# Patient Record
Sex: Female | Born: 1955 | Race: White | Hispanic: No | Marital: Single | Smoking: Former smoker
Health system: Southern US, Community
[De-identification: ages and names within clinical notes are randomized; demographics above are authoritative.]

## PROBLEM LIST (undated history)

## (undated) DIAGNOSIS — T7840XA Allergy, unspecified, initial encounter: Secondary | ICD-10-CM

## (undated) DIAGNOSIS — IMO0001 Reserved for inherently not codable concepts without codable children: Secondary | ICD-10-CM

## (undated) DIAGNOSIS — E785 Hyperlipidemia, unspecified: Secondary | ICD-10-CM

## (undated) HISTORY — DX: Allergy, unspecified, initial encounter: T78.40XA

## (undated) HISTORY — PX: POLYPECTOMY: SHX149

## (undated) HISTORY — DX: Hyperlipidemia, unspecified: E78.5

## (undated) HISTORY — DX: Reserved for inherently not codable concepts without codable children: IMO0001

## (undated) HISTORY — PX: DENTAL SURGERY: SHX609

## (undated) HISTORY — PX: COLONOSCOPY: SHX174

---

## 2000-05-06 ENCOUNTER — Encounter: Payer: Self-pay | Admitting: Gynecology

## 2000-05-06 ENCOUNTER — Ambulatory Visit (HOSPITAL_COMMUNITY): Admission: RE | Admit: 2000-05-06 | Discharge: 2000-05-06 | Payer: Self-pay | Admitting: Gynecology

## 2000-05-31 ENCOUNTER — Other Ambulatory Visit: Admission: RE | Admit: 2000-05-31 | Discharge: 2000-05-31 | Payer: Self-pay | Admitting: Gynecology

## 2003-03-16 ENCOUNTER — Other Ambulatory Visit: Admission: RE | Admit: 2003-03-16 | Discharge: 2003-03-16 | Payer: Self-pay | Admitting: Gynecology

## 2003-08-21 ENCOUNTER — Encounter: Payer: Self-pay | Admitting: Gynecology

## 2003-08-21 ENCOUNTER — Ambulatory Visit (HOSPITAL_COMMUNITY): Admission: RE | Admit: 2003-08-21 | Discharge: 2003-08-21 | Payer: Self-pay | Admitting: Obstetrics and Gynecology

## 2005-02-11 ENCOUNTER — Ambulatory Visit (HOSPITAL_COMMUNITY): Admission: RE | Admit: 2005-02-11 | Discharge: 2005-02-11 | Payer: Self-pay | Admitting: Gynecology

## 2006-05-26 ENCOUNTER — Other Ambulatory Visit: Admission: RE | Admit: 2006-05-26 | Discharge: 2006-05-26 | Payer: Self-pay | Admitting: Gynecology

## 2007-07-22 ENCOUNTER — Ambulatory Visit (HOSPITAL_COMMUNITY): Admission: RE | Admit: 2007-07-22 | Discharge: 2007-07-22 | Payer: Self-pay | Admitting: Internal Medicine

## 2007-07-26 ENCOUNTER — Encounter: Admission: RE | Admit: 2007-07-26 | Discharge: 2007-07-26 | Payer: Self-pay | Admitting: Internal Medicine

## 2008-07-24 ENCOUNTER — Encounter: Admission: RE | Admit: 2008-07-24 | Discharge: 2008-07-24 | Payer: Self-pay | Admitting: Internal Medicine

## 2008-07-27 ENCOUNTER — Encounter: Admission: RE | Admit: 2008-07-27 | Discharge: 2008-07-27 | Payer: Self-pay | Admitting: Internal Medicine

## 2009-03-19 ENCOUNTER — Encounter (INDEPENDENT_AMBULATORY_CARE_PROVIDER_SITE_OTHER): Payer: Self-pay | Admitting: *Deleted

## 2009-07-29 ENCOUNTER — Ambulatory Visit (HOSPITAL_COMMUNITY): Admission: RE | Admit: 2009-07-29 | Discharge: 2009-07-29 | Payer: Self-pay | Admitting: Internal Medicine

## 2010-11-05 ENCOUNTER — Ambulatory Visit (HOSPITAL_COMMUNITY)
Admission: RE | Admit: 2010-11-05 | Discharge: 2010-11-05 | Payer: Self-pay | Source: Home / Self Care | Attending: Internal Medicine | Admitting: Internal Medicine

## 2011-05-07 ENCOUNTER — Other Ambulatory Visit: Payer: Self-pay | Admitting: Women's Health

## 2011-05-07 ENCOUNTER — Other Ambulatory Visit (HOSPITAL_COMMUNITY)
Admission: RE | Admit: 2011-05-07 | Discharge: 2011-05-07 | Disposition: A | Discharge status: Home / Self Care | Payer: BC Managed Care – PPO | Source: Ambulatory Visit | Attending: Gynecology | Admitting: Gynecology

## 2011-05-07 ENCOUNTER — Ambulatory Visit (INDEPENDENT_AMBULATORY_CARE_PROVIDER_SITE_OTHER): Payer: BC Managed Care – PPO | Admitting: Women's Health

## 2011-05-07 DIAGNOSIS — Z13220 Encounter for screening for lipoid disorders: Secondary | ICD-10-CM

## 2011-05-07 DIAGNOSIS — Z01419 Encounter for gynecological examination (general) (routine) without abnormal findings: Secondary | ICD-10-CM

## 2011-05-07 DIAGNOSIS — Z833 Family history of diabetes mellitus: Secondary | ICD-10-CM

## 2011-05-07 DIAGNOSIS — Z124 Encounter for screening for malignant neoplasm of cervix: Secondary | ICD-10-CM | POA: Insufficient documentation

## 2011-05-07 DIAGNOSIS — R82998 Other abnormal findings in urine: Secondary | ICD-10-CM

## 2011-07-27 ENCOUNTER — Other Ambulatory Visit: Payer: Self-pay | Admitting: *Deleted

## 2011-07-27 ENCOUNTER — Telehealth: Payer: Self-pay | Admitting: *Deleted

## 2011-07-27 DIAGNOSIS — Z3049 Encounter for surveillance of other contraceptives: Secondary | ICD-10-CM

## 2011-07-27 MED ORDER — LEVONORGESTREL 20 MCG/24HR IU IUD: INTRAUTERINE_SYSTEM | Freq: Once | INTRAUTERINE | Status: DC

## 2011-07-27 NOTE — Telephone Encounter (Signed)
Informed patient of Mirena benefits. She will have a $25 copay.  Patient will call with next period (Oct 2012)

## 2011-07-27 NOTE — Progress Notes (Signed)
Patient will call with period for IUD insertion with JF ([place] Patient)

## 2011-09-17 DIAGNOSIS — IMO0001 Reserved for inherently not codable concepts without codable children: Secondary | ICD-10-CM | POA: Insufficient documentation

## 2011-09-17 HISTORY — DX: Reserved for inherently not codable concepts without codable children: IMO0001

## 2011-10-02 ENCOUNTER — Other Ambulatory Visit (HOSPITAL_COMMUNITY): Payer: Self-pay | Admitting: Internal Medicine

## 2011-10-02 DIAGNOSIS — Z1231 Encounter for screening mammogram for malignant neoplasm of breast: Secondary | ICD-10-CM

## 2011-10-12 ENCOUNTER — Ambulatory Visit (INDEPENDENT_AMBULATORY_CARE_PROVIDER_SITE_OTHER): Payer: BC Managed Care – PPO | Admitting: Gynecology

## 2011-10-12 ENCOUNTER — Encounter: Payer: Self-pay | Admitting: Gynecology

## 2011-10-12 DIAGNOSIS — Z3049 Encounter for surveillance of other contraceptives: Secondary | ICD-10-CM

## 2011-10-12 HISTORY — PX: INTRAUTERINE DEVICE INSERTION: SHX323

## 2011-10-12 NOTE — Patient Instructions (Signed)
Return in one month for a postinsertional IUD check

## 2011-10-12 NOTE — Progress Notes (Signed)
Patient presents for Mirena IUD placement. She was last seen in June 2012 for her annual.  I reviewed with her her history, she's having regular menses currently using condoms and she wants to go ahead with an IUD. She is 65 and the issue of age was discussed. I offered FSH check first but I'm not sure this ultimately would change what we would do. I discussed all forms of contraception to include pill patch ring, Implanon Depo-Provera, Mirena and ParaGard IUDs. Also permanent sterilization was discussed. I reviewed what is involved with the insertion of the IUD and the acute risks to include perforation requiring surgery to remove as well as infection. Long-term issues of infection and failure with pregnancy was discussed. She does relate getting pregnant with an IUD 30 years ago. I discussed with her that she may be at higher risk of pregnancy with this IUD although noting that it is a progesterone based IUD and probably will work differently but she does accept the possible increased risk of pregnancy associated with this. She has read through the booklet, signed the consent form and is on a normal menses now.  Exam external BUS vagina with heavy menses flow. Small midline anterior vaginal mucosal skin tag noted.  Cervix normal. Uterus normal size midline and mobile. Adnexa without masses or tenderness.  IUD insertion: Cervix was cleansed with Betadine, single-tooth tenaculum anterior lip stabilization, uterus was sounded and subsequently a Mirena IUD was placed according to manufacturer's recommendation without difficulty. The string was trimmed, tenaculum removed, patient tolerated well. Patient will represent in one month for postinsertional check.

## 2011-11-30 ENCOUNTER — Ambulatory Visit (HOSPITAL_COMMUNITY)
Admission: RE | Admit: 2011-11-30 | Discharge: 2011-11-30 | Disposition: A | Discharge status: Home / Self Care | Payer: BC Managed Care – PPO | Source: Ambulatory Visit | Attending: Internal Medicine | Admitting: Internal Medicine

## 2011-11-30 DIAGNOSIS — Z1231 Encounter for screening mammogram for malignant neoplasm of breast: Secondary | ICD-10-CM | POA: Insufficient documentation

## 2011-12-01 ENCOUNTER — Ambulatory Visit (INDEPENDENT_AMBULATORY_CARE_PROVIDER_SITE_OTHER): Payer: BC Managed Care – PPO | Admitting: Gynecology

## 2011-12-01 ENCOUNTER — Encounter: Payer: Self-pay | Admitting: Gynecology

## 2011-12-01 DIAGNOSIS — Z30431 Encounter for routine checking of intrauterine contraceptive device: Secondary | ICD-10-CM

## 2011-12-01 NOTE — Progress Notes (Signed)
Patient presents for IUD follow up. She had Mirena IUD placed November 2012. Has had one period since then no significant intermenstrual bleeding.  Exam was Sherrilyn Rist chaperone present Pelvic: External BUS vagina normal. Cervix normal with IUD string visualized and appropriate length. Uterus normal size shape contour midline mobile nontender. Adnexa without masses or tenderness.  Assessment and plan: IUD checkup. Doing well. She'll keep menstrual calendar and follow up when she is due for her next annual exam, sooner if any issues.

## 2011-12-01 NOTE — Patient Instructions (Signed)
Follow up when you're due for your next annual GYN exam. Sooner if any gynecologic issues.

## 2012-05-09 ENCOUNTER — Ambulatory Visit (INDEPENDENT_AMBULATORY_CARE_PROVIDER_SITE_OTHER): Payer: BC Managed Care – PPO | Admitting: Women's Health

## 2012-05-09 ENCOUNTER — Encounter: Payer: Self-pay | Admitting: Women's Health

## 2012-05-09 VITALS — BP 120/74 | Ht 64.12 in | Wt 143.0 lb

## 2012-05-09 DIAGNOSIS — Z01419 Encounter for gynecological examination (general) (routine) without abnormal findings: Secondary | ICD-10-CM

## 2012-05-09 DIAGNOSIS — Z113 Encounter for screening for infections with a predominantly sexual mode of transmission: Secondary | ICD-10-CM

## 2012-05-09 DIAGNOSIS — B009 Herpesviral infection, unspecified: Secondary | ICD-10-CM

## 2012-05-09 MED ORDER — VALACYCLOVIR HCL 500 MG PO TABS: ORAL_TABLET | ORAL | Status: DC

## 2012-05-09 NOTE — Progress Notes (Signed)
Karina Molina 1956/11/06 [id number]    History:    The patient presents for annual exam.  Monthly 4 days cycle/Mirena IUD placed 11/12. Labs at primary care. 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 New relationship. Has not had a colonoscopy. History of normal Paps and mammograms.   Past medical history, past surgical history, family history and social history were all reviewed and documented in the EPIC chart. This child, 2 sons,  one lives in [place],  one lives in [place], both doing well.   ROS:  A  ROS was performed and pertinent positives and negatives are included in the history.  Exam:  Filed Vitals:   05/09/12 0828  BP: 120/74    General appearance:  Normal Head/Neck:  Normal, without cervical or supraclavicular adenopathy. Thyroid:  Symmetrical, normal in size, without palpable masses or nodularity. Respiratory  Effort:  Normal  Auscultation:  Clear without wheezing or rhonchi Cardiovascular  Auscultation:  Regular rate, without rubs, murmurs or gallops  Edema/varicosities:  Not grossly evident Abdominal  Soft,nontender, without masses, guarding or rebound.  Liver/spleen:  No organomegaly noted  Hernia:  None appreciated  Skin  Inspection:  Grossly normal  Palpation:  Grossly normal Neurologic/psychiatric  Orientation:  Normal with appropriate conversation.  Mood/affect:  Normal  Genitourinary    Breasts: Examined lying and sitting.     Right: Without masses, retractions, discharge or axillary adenopathy.     Left: Without masses, retractions, discharge or axillary adenopathy.   Inguinal/mons:  Normal without inguinal adenopathy  External genitalia:  Normal  BUS/Urethra/Skene's glands:  Normal  Bladder:  Normal  Vagina:  Normal  Cervix:  Normal/IUD strings visible  Uterus:  normal in size, shape and contour.  Midline and mobile  Adnexa/parametria:     Rt: Without masses or tenderness.   Lt: Without masses or tenderness.  Anus and  perineum: Normal  Digital rectal exam: Normal sphincter tone without palpated masses or tenderness  Assessment/Plan:  56 y.o. D. WF G3 P2 for annual exam with no complaints.  HSV 1-rare outbreaks/Valtrex Normal GYN exam monthly 4 days cycle with Mirena IUD/ 11/12  Plan: SBE's, continue annual mammogram, exercise, calcium rich diet, vitamin D 1000 daily encouraged. Reviewed importance of screening colonoscopy, will schedule. Tdap Vaccine reviewed, will get at primary care. Menopause reviewed, continues with a monthly cycle. No Pap, history of all normal Paps, new screening guidelines reviewed. GC/Chlamydia, declines need for HIV hepatitis or RPR. Valtrex 500 by mouth twice a day 3-5 days when necessary prescription given.    Harrington Challenger WHNP, 10:04 AM 05/09/2012

## 2012-05-09 NOTE — Patient Instructions (Addendum)
Vit D 1000 daily Tdap vaccine Colonoscopy Health Maintenance, Females A healthy lifestyle and preventative care can promote health and wellness.  Maintain regular health, dental, and eye exams.   Eat a healthy diet. Foods like vegetables, fruits, whole grains, low-fat dairy products, and lean protein foods contain the nutrients you need without too many calories. Decrease your intake of foods high in solid fats, added sugars, and salt. Get information about a proper diet from your caregiver, if necessary.   Regular physical exercise is one of the most important things you can do for your health. Most adults should get at least 150 minutes of moderate-intensity exercise (any activity that increases your heart rate and causes you to sweat) each week. In addition, most adults need muscle-strengthening exercises on 2 or more days a week.    Maintain a healthy weight. The body mass index (BMI) is a screening tool to identify possible weight problems. It provides an estimate of body fat based on height and weight. Your caregiver can help determine your BMI, and can help you achieve or maintain a healthy weight. For adults 20 years and older:   A BMI below 18.5 is considered underweight.   A BMI of 18.5 to 24.9 is normal.   A BMI of 25 to 29.9 is considered overweight.   A BMI of 30 and above is considered obese.   Maintain normal blood lipids and cholesterol by exercising and minimizing your intake of saturated fat. Eat a balanced diet with plenty of fruits and vegetables. Blood tests for lipids and cholesterol should begin at age 8 and be repeated every 5 years. If your lipid or cholesterol levels are high, you are over 50, or you are a high risk for heart disease, you may need your cholesterol levels checked more frequently.Ongoing high lipid and cholesterol levels should be treated with medicines if diet and exercise are not effective.   If you smoke, find out from your caregiver how to quit.  If you do not use tobacco, do not start.   If you are pregnant, do not drink alcohol. If you are breastfeeding, be very cautious about drinking alcohol. If you are not pregnant and choose to drink alcohol, do not exceed 1 drink per day. One drink is considered to be 12 ounces (355 mL) of beer, 5 ounces (148 mL) of wine, or 1.5 ounces (44 mL) of liquor.   Avoid use of street drugs. Do not share needles with anyone. Ask for help if you need support or instructions about stopping the use of drugs.   High blood pressure causes heart disease and increases the risk of stroke. Blood pressure should be checked at least every 1 to 2 years. Ongoing high blood pressure should be treated with medicines, if weight loss and exercise are not effective.   If you are 48 to 54 years old, ask your caregiver if you should take aspirin to prevent strokes.   Diabetes screening involves taking a blood sample to check your fasting blood sugar level. This should be done once every 3 years, after age 84, if you are within normal weight and without risk factors for diabetes. Testing should be considered at a younger age or be carried out more frequently if you are overweight and have at least 1 risk factor for diabetes.   Breast cancer screening is essential preventative care for women. You should practice "breast self-awareness." This means understanding the normal appearance and feel of your breasts and may include breast  self-examination. Any changes detected, no matter how small, should be reported to a caregiver. Women in their 70s and 30s should have a clinical breast exam (CBE) by a caregiver as part of a regular health exam every 1 to 3 years. After age 30, women should have a CBE every year. Starting at age 47, women should consider having a mammogram (breast X-ray) every year. Women who have a family history of breast cancer should talk to their caregiver about genetic screening. Women at a high risk of breast cancer  should talk to their caregiver about having an MRI and a mammogram every year.   The Pap test is a screening test for cervical cancer. Women should have a Pap test starting at age 61. Between ages 63 and 59, Pap tests should be repeated every 2 years. Beginning at age 74, you should have a Pap test every 3 years as long as the past 3 Pap tests have been normal. If you had a hysterectomy for a problem that was not cancer or a condition that could lead to cancer, then you no longer need Pap tests. If you are between ages 32 and 34, and you have had normal Pap tests going back 10 years, you no longer need Pap tests. If you have had past treatment for cervical cancer or a condition that could lead to cancer, you need Pap tests and screening for cancer for at least 20 years after your treatment. If Pap tests have been discontinued, risk factors (such as a new sexual partner) need to be reassessed to determine if screening should be resumed. Some women have medical problems that increase the chance of getting cervical cancer. In these cases, your caregiver may recommend more frequent screening and Pap tests.   The human papillomavirus (HPV) test is an additional test that may be used for cervical cancer screening. The HPV test looks for the virus that can cause the cell changes on the cervix. The cells collected during the Pap test can be tested for HPV. The HPV test could be used to screen women aged 31 years and older, and should be used in women of any age who have unclear Pap test results. After the age of 59, women should have HPV testing at the same frequency as a Pap test.   Colorectal cancer can be detected and often prevented. Most routine colorectal cancer screening begins at the age of 31 and continues through age 32. However, your caregiver may recommend screening at an earlier age if you have risk factors for colon cancer. On a yearly basis, your caregiver may provide home test kits to check for hidden  blood in the stool. Use of a small camera at the end of a tube, to directly examine the colon (sigmoidoscopy or colonoscopy), can detect the earliest forms of colorectal cancer. Talk to your caregiver about this at age 42, when routine screening begins. Direct examination of the colon should be repeated every 5 to 10 years through age 52, unless early forms of pre-cancerous polyps or small growths are found.   Hepatitis C blood testing is recommended for all people born from [birth year] through 1965 and any individual with known risks for hepatitis C.   Practice safe sex. Use condoms and avoid high-risk sexual practices to reduce the spread of sexually transmitted infections (STIs). Sexually active women aged 41 and younger should be checked for Chlamydia, which is a common sexually transmitted infection. Older women with new or multiple partners should also  be tested for Chlamydia. Testing for other STIs is recommended if you are sexually active and at increased risk.   Osteoporosis is a disease in which the bones lose minerals and strength with aging. This can result in serious bone fractures. The risk of osteoporosis can be identified using a bone density scan. Women ages 72 and over and women at risk for fractures or osteoporosis should discuss screening with their caregivers. Ask your caregiver whether you should be taking a calcium supplement or vitamin D to reduce the rate of osteoporosis.   Menopause can be associated with physical symptoms and risks. Hormone replacement therapy is available to decrease symptoms and risks. You should talk to your caregiver about whether hormone replacement therapy is right for you.   Use sunscreen with a sun protection factor (SPF) of 30 or greater. Apply sunscreen liberally and repeatedly throughout the day. You should seek shade when your shadow is shorter than you. Protect yourself by wearing long sleeves, pants, a wide-brimmed hat, and sunglasses year round, whenever  you are outdoors.   Notify your caregiver of new moles or changes in moles, especially if there is a change in shape or color. Also notify your caregiver if a mole is larger than the size of a pencil eraser.   Stay current with your immunizations.  Document Released: 05/18/2011 Document Revised: 10/22/2011 Document Reviewed: 05/18/2011 [facility] Patient Information 2012 [place], [place].

## 2012-05-10 LAB — URINALYSIS W MICROSCOPIC + REFLEX CULTURE
Bilirubin Urine: NEGATIVE
Casts: NONE SEEN
Crystals: NONE SEEN
Glucose, UA: NEGATIVE mg/dL
Protein, ur: NEGATIVE mg/dL
Specific Gravity, Urine: 1.018 (ref 1.005–1.030)
pH: 5.5 (ref 5.0–8.0)

## 2012-07-13 ENCOUNTER — Telehealth: Payer: Self-pay | Admitting: *Deleted

## 2012-07-13 NOTE — Telephone Encounter (Signed)
Pt called c/o uti requesting rx. Pt is currently on vacation in [place], pt informed to got to a urgent care to be seen.

## 2012-11-25 ENCOUNTER — Other Ambulatory Visit (HOSPITAL_COMMUNITY): Payer: Self-pay | Admitting: Internal Medicine

## 2012-11-25 DIAGNOSIS — Z1231 Encounter for screening mammogram for malignant neoplasm of breast: Secondary | ICD-10-CM

## 2012-12-07 ENCOUNTER — Ambulatory Visit (HOSPITAL_COMMUNITY)
Admission: RE | Admit: 2012-12-07 | Discharge: 2012-12-07 | Disposition: A | Discharge status: Home / Self Care | Payer: BC Managed Care – PPO | Source: Ambulatory Visit | Attending: Internal Medicine | Admitting: Internal Medicine

## 2012-12-07 DIAGNOSIS — Z1231 Encounter for screening mammogram for malignant neoplasm of breast: Secondary | ICD-10-CM | POA: Insufficient documentation

## 2012-12-08 ENCOUNTER — Other Ambulatory Visit: Payer: Self-pay | Admitting: Internal Medicine

## 2012-12-08 DIAGNOSIS — R928 Other abnormal and inconclusive findings on diagnostic imaging of breast: Secondary | ICD-10-CM

## 2012-12-26 ENCOUNTER — Ambulatory Visit
Admission: RE | Admit: 2012-12-26 | Discharge: 2012-12-26 | Disposition: A | Discharge status: Home / Self Care | Payer: BC Managed Care – PPO | Source: Ambulatory Visit | Attending: Internal Medicine | Admitting: Internal Medicine

## 2012-12-26 DIAGNOSIS — R928 Other abnormal and inconclusive findings on diagnostic imaging of breast: Secondary | ICD-10-CM

## 2013-08-23 ENCOUNTER — Ambulatory Visit (INDEPENDENT_AMBULATORY_CARE_PROVIDER_SITE_OTHER): Payer: BC Managed Care – PPO | Admitting: Women's Health

## 2013-08-23 ENCOUNTER — Other Ambulatory Visit (HOSPITAL_COMMUNITY)
Admission: RE | Admit: 2013-08-23 | Discharge: 2013-08-23 | Disposition: A | Discharge status: Home / Self Care | Payer: BC Managed Care – PPO | Source: Ambulatory Visit | Attending: Gynecology | Admitting: Gynecology

## 2013-08-23 ENCOUNTER — Encounter: Payer: Self-pay | Admitting: Women's Health

## 2013-08-23 VITALS — BP 110/70 | Ht 64.0 in | Wt 152.0 lb

## 2013-08-23 DIAGNOSIS — Z01419 Encounter for gynecological examination (general) (routine) without abnormal findings: Secondary | ICD-10-CM | POA: Insufficient documentation

## 2013-08-23 DIAGNOSIS — B009 Herpesviral infection, unspecified: Secondary | ICD-10-CM

## 2013-08-23 MED ORDER — VALACYCLOVIR HCL 500 MG PO TABS: ORAL_TABLET | ORAL | Status: DC

## 2013-08-23 NOTE — Progress Notes (Signed)
[place]ina [place] 1956/02/28 [id number]    History:    The patient presents for annual exam.  Mirena IUD placed 09/2011 continues to have light monthly or every 2-3 months cycles. Denies any hot flushes or menopausal symptoms. HSV 1 with rare outbreaks. Same partner. Normal Pap history. Mammogram 2014 followup ultrasound normal, breast noted to be dense.  Past medical history, past surgical history, family history and social history were all reviewed and documented in the EPIC chart. 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 Father diabetes, heart disease, hypercholesteremia.   ROS:  A  ROS was performed and pertinent positives and negatives are included in the history.  Exam:  Filed Vitals:   08/23/13 0805  BP: 110/70    General appearance:  Normal Head/Neck:  Normal, without cervical or supraclavicular adenopathy. Thyroid:  Symmetrical, normal in size, without palpable masses or nodularity. Respiratory  Effort:  Normal  Auscultation:  Clear without wheezing or rhonchi Cardiovascular  Auscultation:  Regular rate, without rubs, murmurs or gallops  Edema/varicosities:  Not grossly evident Abdominal  Soft,nontender, without masses, guarding or rebound.  Liver/spleen:  No organomegaly noted  Hernia:  None appreciated  Skin  Inspection:  Grossly normal  Palpation:  Grossly normal Neurologic/psychiatric  Orientation:  Normal with appropriate conversation.  Mood/affect:  Normal  Genitourinary    Breasts: Examined lying and sitting.     Right: Without masses, retractions, discharge or axillary adenopathy.     Left: Without masses, retractions, discharge or axillary adenopathy.   Inguinal/mons:  Normal without inguinal adenopathy  External genitalia:  Normal  BUS/Urethra/Skene's glands:  Normal  Bladder:  Normal  Vagina:  Normal  Cervix:  Normal IUD strings visible  Uterus:   normal in size, shape and contour.  Midline and mobile  Adnexa/parametria:      Rt: Without masses or tenderness.   Lt: Without masses or tenderness.  Anus and perineum: Normal  Digital rectal exam: Normal sphincter tone without palpated masses or tenderness  Assessment/Plan:  57 y.o. DWF G3P2 for annual exam with no complaints.  HSV 1 rare outbreaks Normal GYN exam Mirena IUD placed 09/2011  Plan: Reviewed importance of screening colonoscopy instructed to schedule. Menopause reviewed, instructed to call if problematic symptoms. SBE's, continue annual mammogram, repeat tomography reviewed and encouraged to 2 history of dense breast. Continue regular exercise routine, calcium rich diet, vitamin D 2000 daily encouraged. Labs at primary care. Pap, Pap normal 2012, new screening guidelines reviewed. Valtrex 500 twice daily 3-5 days as needed.    Harrington Challenger [facility], 8:27 AM 08/[birth year]/1953

## 2013-08-23 NOTE — Patient Instructions (Signed)

## 2013-08-23 NOTE — Addendum Note (Signed)
Addended by: Richardson Chiquito on: 08/23/2013 09:53 AM   Modules accepted: Orders

## 2013-08-29 ENCOUNTER — Telehealth: Payer: Self-pay | Admitting: *Deleted

## 2013-08-29 DIAGNOSIS — B009 Herpesviral infection, unspecified: Secondary | ICD-10-CM

## 2013-08-29 NOTE — Telephone Encounter (Signed)
Prior authorization for Valacyclovir 500 mg faxed to [facility]) will wait for approval.

## 2013-09-05 MED ORDER — VALACYCLOVIR HCL 500 MG PO TABS: ORAL_TABLET | ORAL | Status: DC

## 2013-09-05 NOTE — Telephone Encounter (Signed)
Okay change to #21 with refills. Thanks

## 2013-09-05 NOTE — Telephone Encounter (Signed)
Pt Valacyclovir 500 mg 1 tablet twice daily for 3-5 days #30. This Rx was denied by insurance company the quantity allow up to #21 per 30 days for treatment HSV-1. Okay to send #21 in to pharmacy for pt to have Rx? Please advise

## 2013-09-05 NOTE — Telephone Encounter (Signed)
Rx sent to pharmacy, informed pharmacy with this as well.

## 2013-09-21 ENCOUNTER — Other Ambulatory Visit: Payer: Self-pay

## 2014-02-26 ENCOUNTER — Other Ambulatory Visit: Payer: Self-pay

## 2014-02-26 DIAGNOSIS — Z1231 Encounter for screening mammogram for malignant neoplasm of breast: Secondary | ICD-10-CM

## 2014-02-27 ENCOUNTER — Ambulatory Visit
Admission: RE | Admit: 2014-02-27 | Discharge: 2014-02-27 | Disposition: A | Discharge status: Home / Self Care | Payer: BC Managed Care – PPO | Source: Ambulatory Visit

## 2014-02-27 DIAGNOSIS — Z1231 Encounter for screening mammogram for malignant neoplasm of breast: Secondary | ICD-10-CM

## 2014-03-01 ENCOUNTER — Other Ambulatory Visit: Payer: Self-pay | Admitting: Internal Medicine

## 2014-03-01 DIAGNOSIS — R928 Other abnormal and inconclusive findings on diagnostic imaging of breast: Secondary | ICD-10-CM

## 2014-03-13 ENCOUNTER — Ambulatory Visit
Admission: RE | Admit: 2014-03-13 | Discharge: 2014-03-13 | Disposition: A | Discharge status: Home / Self Care | Payer: BC Managed Care – PPO | Source: Ambulatory Visit | Attending: Internal Medicine | Admitting: Internal Medicine

## 2014-03-13 ENCOUNTER — Other Ambulatory Visit: Payer: Self-pay | Admitting: Internal Medicine

## 2014-03-13 DIAGNOSIS — R928 Other abnormal and inconclusive findings on diagnostic imaging of breast: Secondary | ICD-10-CM

## 2014-08-29 ENCOUNTER — Other Ambulatory Visit: Payer: Self-pay | Admitting: Internal Medicine

## 2014-08-29 DIAGNOSIS — R921 Mammographic calcification found on diagnostic imaging of breast: Secondary | ICD-10-CM

## 2014-09-05 ENCOUNTER — Ambulatory Visit
Admission: RE | Admit: 2014-09-05 | Discharge: 2014-09-05 | Disposition: A | Discharge status: Home / Self Care | Payer: BC Managed Care – PPO | Source: Ambulatory Visit | Attending: Internal Medicine | Admitting: Internal Medicine

## 2014-09-05 DIAGNOSIS — R921 Mammographic calcification found on diagnostic imaging of breast: Secondary | ICD-10-CM

## 2014-09-17 ENCOUNTER — Encounter: Payer: Self-pay | Admitting: Women's Health

## 2014-12-30 ENCOUNTER — Other Ambulatory Visit: Payer: Self-pay | Admitting: Women's Health

## 2015-01-08 ENCOUNTER — Other Ambulatory Visit: Payer: Self-pay | Admitting: Internal Medicine

## 2015-01-08 ENCOUNTER — Encounter: Payer: Self-pay | Admitting: Internal Medicine

## 2015-01-08 DIAGNOSIS — F17211 Nicotine dependence, cigarettes, in remission: Secondary | ICD-10-CM

## 2015-01-14 ENCOUNTER — Ambulatory Visit
Admission: RE | Admit: 2015-01-14 | Discharge: 2015-01-14 | Disposition: A | Discharge status: Home / Self Care | Payer: Self-pay | Source: Ambulatory Visit | Attending: Internal Medicine | Admitting: Internal Medicine

## 2015-01-14 DIAGNOSIS — F17211 Nicotine dependence, cigarettes, in remission: Secondary | ICD-10-CM

## 2015-02-13 ENCOUNTER — Encounter: Payer: Self-pay | Admitting: Internal Medicine

## 2015-03-20 ENCOUNTER — Ambulatory Visit (AMBULATORY_SURGERY_CENTER): Payer: Self-pay

## 2015-03-20 VITALS — Ht 64.0 in | Wt 152.0 lb

## 2015-03-20 DIAGNOSIS — Z1211 Encounter for screening for malignant neoplasm of colon: Secondary | ICD-10-CM

## 2015-03-20 MED ORDER — MOVIPREP 100 G PO SOLR: 1.0000 | Freq: Once | ORAL | Status: DC

## 2015-03-20 NOTE — Progress Notes (Signed)
No allergies to eggs or soy No home oxygen No diet/weight loss meds No past problems with anesthesia  Has email  Emmi instructions given for colonoscopy 

## 2015-03-22 ENCOUNTER — Other Ambulatory Visit: Payer: Self-pay

## 2015-03-22 DIAGNOSIS — Z1231 Encounter for screening mammogram for malignant neoplasm of breast: Secondary | ICD-10-CM

## 2015-04-03 ENCOUNTER — Ambulatory Visit (AMBULATORY_SURGERY_CENTER): Payer: BLUE CROSS/BLUE SHIELD | Admitting: Internal Medicine

## 2015-04-03 ENCOUNTER — Other Ambulatory Visit: Payer: Self-pay | Admitting: Internal Medicine

## 2015-04-03 ENCOUNTER — Encounter: Payer: Self-pay | Admitting: Internal Medicine

## 2015-04-03 VITALS — BP 117/85 | HR 62 | Temp 97.5°F | Resp 20 | Ht 64.0 in | Wt 152.0 lb

## 2015-04-03 DIAGNOSIS — D125 Benign neoplasm of sigmoid colon: Secondary | ICD-10-CM

## 2015-04-03 DIAGNOSIS — Z1211 Encounter for screening for malignant neoplasm of colon: Secondary | ICD-10-CM

## 2015-04-03 DIAGNOSIS — D128 Benign neoplasm of rectum: Secondary | ICD-10-CM | POA: Diagnosis not present

## 2015-04-03 DIAGNOSIS — D129 Benign neoplasm of anus and anal canal: Secondary | ICD-10-CM

## 2015-04-03 MED ORDER — SODIUM CHLORIDE 0.9 % IV SOLN
500.0000 mL | INTRAVENOUS | Status: DC
Start: 2015-04-03 — End: 2015-04-04

## 2015-04-03 NOTE — Progress Notes (Signed)
Stable to RR 

## 2015-04-03 NOTE — Op Note (Signed)
[place]  [address] [place], [id number]   COLONOSCOPY PROCEDURE REPORT  PATIENT: [place], [place]  MR#: [id number] BIRTHDATE: 1956-05-28 , 58  yrs. old GENDER: female ENDOSCOPIST: Lafayette Dragon, MD REFERRED BY:W.  Lutricia Feil, M.D. PROCEDURE DATE:  04/03/2015 PROCEDURE:   Colonoscopy, screening and Colonoscopy with snare polypectomy First Screening Colonoscopy - Avg.  risk and is 50 yrs.  old or older Yes.  Prior Negative Screening - Now for repeat screening. N/A  History of Adenoma - Now for follow-up colonoscopy & has been > or = to 3 yrs.  N/A  Polyps removed today? Yes ASA CLASS:   Class I INDICATIONS:Screening for colonic neoplasia and Colorectal Neoplasm Risk Assessment for this procedure is average risk. MEDICATIONS: Monitored anesthesia care and Propofol 300 mg IV  DESCRIPTION OF PROCEDURE:   After the risks benefits and alternatives of the procedure were thoroughly explained, informed consent was obtained.  The digital rectal exam revealed no abnormalities of the rectum.   The LB PCF Q180 [id number]  endoscope was introduced through the anus and advanced to the cecum, which was identified by both the appendix and ileocecal valve. No adverse events experienced.   The quality of the prep was good.  (MoviPrep was used)  The instrument was then slowly withdrawn as the colon was fully examined. Estimated blood loss is zero unless otherwise noted in this procedure report.      COLON FINDINGS: Two polypoid shaped sessile polyps measuring 8 mm in size were found in the rectum and sigmoid colon.  A polypectomy was performed with a cold snare.  The resection was complete, the polyp tissue was completely retrieved and sent to histology.  A polypectomy was performed using snare cautery.  The resection was complete, the polyp tissue was completely retrieved and sent to histology.   There was mild diverticulosis noted in the sigmoid colon with associated  muscular hypertrophy.  Retroflexed views revealed no abnormalities. The time to cecum = 4.26 Withdrawal time = 12.53   The scope was withdrawn and the procedure completed. COMPLICATIONS: There were no immediate complications.  ENDOSCOPIC IMPRESSION: 1.   Two sessile polyps were found in the rectum and sigmoid colon; polypectomy was performed with a cold snare in the rectum polypectomy was performed using snare cautery in sigmoid colon 2.   There was mild diverticulosis noted in the sigmoid colon  RECOMMENDATIONS: 1.  Await pathology results 2.  High fiber diet Recall colonoscopy pending path report.  Probably in 5 years  eSigned:  Lafayette Dragon, MD 04/03/2015 10:13 AM   cc:   PATIENT NAME:  [place], [place] MR#: [id number]

## 2015-04-03 NOTE — Progress Notes (Signed)
Called to room to assist during endoscopic procedure.  Patient ID and intended procedure confirmed with present staff. Received instructions for my participation in the procedure from the performing physician.  

## 2015-04-03 NOTE — Patient Instructions (Signed)
YOU HAD AN ENDOSCOPIC PROCEDURE TODAY AT [facility]:   Refer to the procedure report that was given to you for any specific questions about what was found during the examination.  If the procedure report does not answer your questions, please call your gastroenterologist to clarify.  If you requested that your care partner not be given the details of your procedure findings, then the procedure report has been included in a sealed envelope for you to review at your convenience later.  YOU SHOULD EXPECT: Some feelings of bloating in the abdomen. Passage of more gas than usual.  Walking can help get rid of the air that was put into your GI tract during the procedure and reduce the bloating. If you had a lower endoscopy (such as a colonoscopy or flexible sigmoidoscopy) you may notice spotting of blood in your stool or on the toilet paper. If you underwent a bowel prep for your procedure, you may not have a normal bowel movement for a few days.  Please Note:  You might notice some irritation and congestion in your nose or some drainage.  This is from the oxygen used during your procedure.  There is no need for concern and it should clear up in a day or so.  SYMPTOMS TO REPORT IMMEDIATELY:   Following lower endoscopy (colonoscopy or flexible sigmoidoscopy):  Excessive amounts of blood in the stool  Significant tenderness or worsening of abdominal pains  Swelling of the abdomen that is new, acute  Fever of 100F or higher   For urgent or emergent issues, a gastroenterologist can be reached at any hour by calling [phone].   DIET: Your first meal following the procedure should be a small meal and then it is ok to progress to your normal diet. Heavy or fried foods are harder to digest and may make you feel nauseous or bloated.  Likewise, meals heavy in dairy and vegetables can increase bloating.  Drink plenty of fluids but you should avoid alcoholic beverages for 24  hours.  ACTIVITY:  You should plan to take it easy for the rest of today and you should NOT DRIVE or use heavy machinery until tomorrow (because of the sedation medicines used during the test).    FOLLOW UP: Our staff will call the number listed on your records the next business day following your procedure to check on you and address any questions or concerns that you may have regarding the information given to you following your procedure. If we do not reach you, we will leave a message.  However, if you are feeling well and you are not experiencing any problems, there is no need to return our call.  We will assume that you have returned to your regular daily activities without incident.  If any biopsies were taken you will be contacted by phone or by letter within the next 1-3 weeks.  Please call us at [phone] if you have not heard about the biopsies in 3 weeks.    SIGNATURES/CONFIDENTIALITY: You and/or your care partner have signed paperwork which will be entered into your electronic medical record.  These signatures attest to the fact that that the information above on your After Visit Summary has been reviewed and is understood.  Full responsibility of the confidentiality of this discharge information lies with you and/or your care-partner.  Polyp, diverticulosis, and high fiber information given.

## 2015-04-04 ENCOUNTER — Telehealth: Payer: Self-pay | Admitting: *Deleted

## 2015-04-04 NOTE — Telephone Encounter (Signed)
  Follow up Call-  Call back number 04/03/2015  Post procedure Call Back phone  # cell [phone]  Permission to leave phone message Yes     Patient questions:  Do you have a fever, pain , or abdominal swelling? No. Pain Score  0 *  Have you tolerated food without any problems? Yes.    Have you been able to return to your normal activities? Yes.    Do you have any questions about your discharge instructions: Diet   No. Medications  No. Follow up visit  No.  Do you have questions or concerns about your Care? No.  Actions: * If pain score is 4 or above: No action needed, pain <4.

## 2015-04-11 ENCOUNTER — Encounter: Payer: Self-pay | Admitting: Internal Medicine

## 2015-04-30 ENCOUNTER — Ambulatory Visit (INDEPENDENT_AMBULATORY_CARE_PROVIDER_SITE_OTHER): Payer: BLUE CROSS/BLUE SHIELD | Admitting: Women's Health

## 2015-04-30 ENCOUNTER — Encounter: Payer: Self-pay | Admitting: Women's Health

## 2015-04-30 ENCOUNTER — Other Ambulatory Visit: Payer: Self-pay | Admitting: Internal Medicine

## 2015-04-30 VITALS — BP 120/70 | Ht 65.0 in | Wt 155.0 lb

## 2015-04-30 DIAGNOSIS — Z01419 Encounter for gynecological examination (general) (routine) without abnormal findings: Secondary | ICD-10-CM

## 2015-04-30 DIAGNOSIS — R921 Mammographic calcification found on diagnostic imaging of breast: Secondary | ICD-10-CM

## 2015-04-30 NOTE — Progress Notes (Signed)
Karina Molina [birth year] [id number]    History:    Presents for annual exam.  11/12 Mirena IUD, rare once yearly 3 day bleeding. Denies menopausal symptoms. Normal Pap history has been having follow-up right breast ultrasounds since 2014/normal, mammogram/density. DEXA reports as normal at primary care. Labs primary care. Quit smoking approximately 9 years ago negative chest x-ray 12/2014. HSV 1. 52016 2 benign colon polyps, 5 year follow-up.  Past medical history, past surgical history, family history and social history were all reviewed and documented in the EPIC chart. Reports stressful job. 2 kids 1 lives in [place], one in [place]. Same partner greater than 4 years. Father diabetes/heart disease/hypercholesterolemia.  ROS:  A ROS was performed and pertinent positives and negatives are included.  Exam:  Filed Vitals:   04/29/56 0815  BP: 120/70    General appearance:  Normal Thyroid:  Symmetrical, normal in size, without palpable masses or nodularity. Respiratory  Auscultation:  Clear without wheezing or rhonchi Cardiovascular  Auscultation:  Regular rate, without rubs, murmurs or gallops  Edema/varicosities:  Not grossly evident Abdominal  Soft,nontender, without masses, guarding or rebound.  Liver/spleen:  No organomegaly noted  Hernia:  None appreciated  Skin  Inspection:  Grossly normal   Breasts: Examined lying and sitting.     Right: Without masses, retractions, discharge or axillary adenopathy.     Left: Without masses, retractions, discharge or axillary adenopathy. Gentitourinary   Inguinal/mons:  Normal without inguinal adenopathy  External genitalia:  Normal  BUS/Urethra/Skene's glands:  Normal  Vagina:  Normal  Cervix:  Normal IUD strings visible  Uterus:  normal in size, shape and contour.  Midline and mobile  Adnexa/parametria:     Rt: Without masses or tenderness.   Lt: Without masses or tenderness.  Anus and perineum: Normal  Digital rectal  exam: Normal sphincter tone without palpated masses or tenderness  Assessment/Plan:  59 y.o. D WF G3 P2 for annual exam with no complaints.  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 Report any unusual bleeding. SBE's, continue annual 3-D mammogram history of dense breasts. Increase regular exercise, increase stress reducing activities, calcium rich diet, vitamin D 2000 daily encouraged. UA. Pap normal 2014, new screening guidelines reviewed.  Karina Molina, 8:44 AM 04/30/2015

## 2015-04-30 NOTE — Patient Instructions (Signed)
Health Recommendations for Postmenopausal Women Respected and ongoing research has looked at the most common causes of death, disability, and poor quality of life in postmenopausal women. The causes include heart disease, diseases of blood vessels, diabetes, depression, cancer, and bone loss (osteoporosis). Many things can be done to help lower the chances of developing these and other common problems. CARDIOVASCULAR DISEASE Heart Disease: A heart attack is a medical emergency. Know the signs and symptoms of a heart attack. Below are things women can do to reduce their risk for heart disease.   Do not smoke. If you smoke, quit.  Aim for a healthy weight. Being overweight causes many preventable deaths. Eat a healthy and balanced diet and drink an adequate amount of liquids.  Get moving. Make a commitment to be more physically active. Aim for 30 minutes of activity on most, if not all days of the week.  Eat for heart health. Choose a diet that is low in saturated fat and cholesterol and eliminate trans fat. Include whole grains, vegetables, and fruits. Read and understand the labels on food containers before buying.  Know your numbers. Ask your caregiver to check your blood pressure, cholesterol (total, HDL, LDL, triglycerides) and blood glucose. Work with your caregiver on improving your entire clinical picture.  High blood pressure. Limit or stop your table salt intake (try salt substitute and food seasonings). Avoid salty foods and drinks. Read labels on food containers before buying. Eating well and exercising can help control high blood pressure. STROKE  Stroke is a medical emergency. Stroke may be the result of a blood clot in a blood vessel in the brain or by a brain hemorrhage (bleeding). Know the signs and symptoms of a stroke. To lower the risk of developing a stroke:  Avoid fatty foods.  Quit smoking.  Control your diabetes, blood pressure, and irregular heart rate. THROMBOPHLEBITIS  (BLOOD CLOT) OF THE LEG  Becoming overweight and leading a stationary lifestyle may also contribute to developing blood clots. Controlling your diet and exercising will help lower the risk of developing blood clots. CANCER SCREENING  Breast Cancer: Take steps to reduce your risk of breast cancer.  You should practice "breast self-awareness." This means understanding the normal appearance and feel of your breasts and should include breast self-examination. Any changes detected, no matter how small, should be reported to your caregiver.  After age 4, you should have a clinical breast exam (CBE) every year.  Starting at age 48, you should consider having a mammogram (breast X-ray) every year.  If you have a family history of breast cancer, talk to your caregiver about genetic screening.  If you are at high risk for breast cancer, talk to your caregiver about having an MRI and a mammogram every year.  Intestinal or Stomach Cancer: Tests to consider are a rectal exam, fecal occult blood, sigmoidoscopy, and colonoscopy. Women who are high risk may need to be screened at an earlier age and more often.  Cervical Cancer:  Beginning at age 72, you should have a Pap test every 3 years as long as the past 3 Pap tests have been normal.  If you have had past treatment for cervical cancer or a condition that could lead to cancer, you need Pap tests and screening for cancer for at least 20 years after your treatment.  If you had a hysterectomy for a problem that was not cancer or a condition that could lead to cancer, then you no longer need Pap tests.  If you are between ages 65 and 70, and you have had normal Pap tests going back 10 years, you no longer need Pap tests.  If Pap tests have been discontinued, risk factors (such as a new sexual partner) need to be reassessed to determine if screening should be resumed.  Some medical problems can increase the chance of getting cervical cancer. In these  cases, your caregiver may recommend more frequent screening and Pap tests.  Uterine Cancer: If you have vaginal bleeding after reaching menopause, you should notify your caregiver.  Ovarian Cancer: Other than yearly pelvic exams, there are no reliable tests available to screen for ovarian cancer at this time except for yearly pelvic exams.  Lung Cancer: Yearly chest X-rays can detect lung cancer and should be done on high risk women, such as cigarette smokers and women with chronic lung disease (emphysema).  Skin Cancer: A complete body skin exam should be done at your yearly examination. Avoid overexposure to the sun and ultraviolet light lamps. Use a strong sun block cream when in the sun. All of these things are important for lowering the risk of skin cancer. MENOPAUSE Menopause Symptoms: Hormone therapy products are effective for treating symptoms associated with menopause:  Moderate to severe hot flashes.  Night sweats.  Mood swings.  Headaches.  Tiredness.  Loss of sex drive.  Insomnia.  Other symptoms. Hormone replacement carries certain risks, especially in older women. Women who use or are thinking about using estrogen or estrogen with progestin treatments should discuss that with their caregiver. Your caregiver will help you understand the benefits and risks. The ideal dose of hormone replacement therapy is not known. The Food and Drug Administration (FDA) has concluded that hormone therapy should be used only at the lowest doses and for the shortest amount of time to reach treatment goals.  OSTEOPOROSIS Protecting Against Bone Loss and Preventing Fracture If you use hormone therapy for prevention of bone loss (osteoporosis), the risks for bone loss must outweigh the risk of the therapy. Ask your caregiver about other medications known to be safe and effective for preventing bone loss and fractures. To guard against bone loss or fractures, the following is recommended:  If  you are younger than age 50, take 1000 mg of calcium and at least 600 mg of Vitamin D per day.  If you are older than age 50 but younger than age 70, take 1200 mg of calcium and at least 600 mg of Vitamin D per day.  If you are older than age 70, take 1200 mg of calcium and at least 800 mg of Vitamin D per day. Smoking and excessive alcohol intake increases the risk of osteoporosis. Eat foods rich in calcium and vitamin D and do weight bearing exercises several times a week as your caregiver suggests. DIABETES Diabetes Mellitus: If you have type I or type 2 diabetes, you should keep your blood sugar under control with diet, exercise, and recommended medication. Avoid starchy and fatty foods, and too many sweets. Being overweight can make diabetes control more difficult. COGNITION AND MEMORY Cognition and Memory: Menopausal hormone therapy is not recommended for the prevention of cognitive disorders such as Alzheimer's disease or memory loss.  DEPRESSION  Depression may occur at any age, but it is common in elderly women. This may be because of physical, medical, social (loneliness), or financial problems and needs. If you are experiencing depression because of medical problems and control of symptoms, talk to your caregiver about this. Physical   activity and exercise may help with mood and sleep. Community and volunteer involvement may improve your sense of value and worth. If you have depression and you feel that the problem is getting worse or becoming severe, talk to your caregiver about which treatment options are best for you. ACCIDENTS  Accidents are common and can be serious in elderly woman. Prepare your house to prevent accidents. Eliminate throw rugs, place hand bars in bath, shower, and toilet areas. Avoid wearing high heeled shoes or walking on wet, snowy, and icy areas. Limit or stop driving if you have vision or hearing problems, or if you feel you are unsteady with your movements and  reflexes. HEPATITIS C Hepatitis C is a type of viral infection affecting the liver. It is spread mainly through contact with blood from an infected person. It can be treated, but if left untreated, it can lead to severe liver damage over the years. Many people who are infected do not know that the virus is in their blood. If you are a "baby-boomer", it is recommended that you have one screening test for Hepatitis C. IMMUNIZATIONS  Several immunizations are important to consider having during your senior years, including:   Tetanus, diphtheria, and pertussis booster shot.  Influenza every year before the flu season begins.  Pneumonia vaccine.  Shingles vaccine.  Others, as indicated based on your specific needs. Talk to your caregiver about these. Document Released: 12/25/2005 Document Revised: 03/19/2014 Document Reviewed: 08/20/2008 ExitCare Patient Information 2015 [employer]. This information is not intended to replace advice given to you by your health care provider. Make sure you discuss any questions you have with your health care provider. Exercise to Stay Healthy Exercise helps you become and stay healthy. EXERCISE IDEAS AND TIPS Choose exercises that:  You enjoy.  Fit into your day. You do not need to exercise really hard to be healthy. You can do exercises at a slow or medium level and stay healthy. You can:  Stretch before and after working out.  Try yoga, Pilates, or tai chi.  Lift weights.  Walk fast, swim, jog, run, climb stairs, bicycle, dance, or rollerskate.  Take aerobic classes. Exercises that burn about 150 calories:  Running 1  miles in 15 minutes.  Playing volleyball for 45 to 60 minutes.  Washing and waxing a car for 45 to 60 minutes.  Playing touch football for 45 minutes.  Walking 1  miles in 35 minutes.  Pushing a stroller 1  miles in 30 minutes.  Playing basketball for 30 minutes.  Raking leaves for 30 minutes.  Bicycling 5  miles in 30 minutes.  Walking 2 miles in 30 minutes.  Dancing for 30 minutes.  Shoveling snow for 15 minutes.  Swimming laps for 20 minutes.  Walking up stairs for 15 minutes.  Bicycling 4 miles in 15 minutes.  Gardening for 30 to 45 minutes.  Jumping rope for 15 minutes.  Washing windows or floors for 45 to 60 minutes. Document Released: 12/05/2010 Document Revised: 01/25/2012 Document Reviewed: 12/05/2010 ExitCare Patient Information 2015 [employer]. This information is not intended to replace advice given to you by your health care provider. Make sure you discuss any questions you have with your health care provider.  

## 2015-05-01 LAB — URINALYSIS W MICROSCOPIC + REFLEX CULTURE
BILIRUBIN URINE: NEGATIVE
CRYSTALS: NONE SEEN
Casts: NONE SEEN
Glucose, UA: NEGATIVE mg/dL
Hgb urine dipstick: NEGATIVE
KETONES UR: NEGATIVE mg/dL
Leukocytes, UA: NEGATIVE
Nitrite: NEGATIVE
Protein, ur: NEGATIVE mg/dL
SPECIFIC GRAVITY, URINE: 1.016 (ref 1.005–1.030)
Urobilinogen, UA: 0.2 mg/dL (ref 0.0–1.0)
pH: 7 (ref 5.0–8.0)

## 2015-05-02 ENCOUNTER — Ambulatory Visit
Admission: RE | Admit: 2015-05-02 | Discharge: 2015-05-02 | Disposition: A | Discharge status: Home / Self Care | Payer: BLUE CROSS/BLUE SHIELD | Source: Ambulatory Visit | Attending: Internal Medicine | Admitting: Internal Medicine

## 2015-05-02 ENCOUNTER — Ambulatory Visit: Payer: BLUE CROSS/BLUE SHIELD

## 2015-05-02 DIAGNOSIS — R921 Mammographic calcification found on diagnostic imaging of breast: Secondary | ICD-10-CM

## 2015-05-02 LAB — URINE CULTURE

## 2016-01-11 ENCOUNTER — Other Ambulatory Visit: Payer: Self-pay | Admitting: Acute Care

## 2016-01-11 DIAGNOSIS — Z87891 Personal history of nicotine dependence: Secondary | ICD-10-CM

## 2016-02-27 ENCOUNTER — Ambulatory Visit
Admission: RE | Admit: 2016-02-27 | Discharge: 2016-02-27 | Disposition: A | Discharge status: Home / Self Care | Payer: BLUE CROSS/BLUE SHIELD | Source: Ambulatory Visit | Attending: Acute Care | Admitting: Acute Care

## 2016-02-27 DIAGNOSIS — Z87891 Personal history of nicotine dependence: Secondary | ICD-10-CM

## 2016-03-11 ENCOUNTER — Other Ambulatory Visit: Payer: Self-pay | Admitting: Acute Care

## 2016-03-11 ENCOUNTER — Telehealth: Payer: Self-pay | Admitting: Acute Care

## 2016-03-11 DIAGNOSIS — Z87891 Personal history of nicotine dependence: Secondary | ICD-10-CM

## 2016-03-11 NOTE — Telephone Encounter (Signed)
I have called the CT screening scan results to the patient. I explained that a Lung RADS 2: indicated nodules that are benign in appearance and behavior with a very low likelihood of becoming a clinically active cancer due to size or lack of growth. Recommendation per radiology is for a repeat LDCT in 12 months. I told her we will schedule her annual scan in 12 months. She verbalized understanding and had no further questions.

## 2016-07-23 ENCOUNTER — Other Ambulatory Visit: Payer: Self-pay | Admitting: Internal Medicine

## 2016-07-23 DIAGNOSIS — N63 Unspecified lump in unspecified breast: Secondary | ICD-10-CM

## 2016-07-30 ENCOUNTER — Ambulatory Visit
Admission: RE | Admit: 2016-07-30 | Discharge: 2016-07-30 | Disposition: A | Discharge status: Home / Self Care | Payer: BLUE CROSS/BLUE SHIELD | Source: Ambulatory Visit | Attending: Internal Medicine | Admitting: Internal Medicine

## 2016-07-30 DIAGNOSIS — N63 Unspecified lump in unspecified breast: Secondary | ICD-10-CM

## 2017-01-06 ENCOUNTER — Ambulatory Visit (INDEPENDENT_AMBULATORY_CARE_PROVIDER_SITE_OTHER): Payer: BLUE CROSS/BLUE SHIELD | Admitting: Women's Health

## 2017-01-06 ENCOUNTER — Encounter: Payer: Self-pay | Admitting: Women's Health

## 2017-01-06 VITALS — BP 132/80 | Ht 64.0 in | Wt 146.6 lb

## 2017-01-06 DIAGNOSIS — B009 Herpesviral infection, unspecified: Secondary | ICD-10-CM

## 2017-01-06 DIAGNOSIS — Z30432 Encounter for removal of intrauterine contraceptive device: Secondary | ICD-10-CM | POA: Diagnosis not present

## 2017-01-06 DIAGNOSIS — Z01419 Encounter for gynecological examination (general) (routine) without abnormal findings: Secondary | ICD-10-CM

## 2017-01-06 MED ORDER — VALACYCLOVIR HCL 500 MG PO TABS: ORAL_TABLET | ORAL | 6 refills | Status: AC

## 2017-01-06 NOTE — Progress Notes (Signed)
Karina Molina 15-Dec-1955 [id number]    History:    Presents for annual exam.   09/2011  Mirena IUD  amenorrhea. History of HSV-1 rare outbreaks. Normal Pap and mammogram history. Primary care manages labs and reports normal DEXA at [facility]. 2016 2 benign colon polyps has a 5 year follow-up. Same partner.  Past medical history, past surgical history, family history and social history were all reviewed and documented in the EPIC chart. 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 Father diabetes, heart disease and hypercholesterolemia.  ROS:  A ROS was performed and pertinent positives and negatives are included.  Exam:  Vitals:   01/06/17 1513  BP: 132/80  Weight: 146 lb 9.6 oz (66.5 kg)  Height: 5\' 4"  (1.626 m)   Body mass index is 25.16 kg/m.   General appearance:  Normal Thyroid:  Symmetrical, normal in size, without palpable masses or nodularity. Respiratory  Auscultation:  Clear without wheezing or rhonchi Cardiovascular  Auscultation:  Regular rate, without rubs, murmurs or gallops  Edema/varicosities:  Not grossly evident Abdominal  Soft,nontender, without masses, guarding or rebound.  Liver/spleen:  No organomegaly noted  Hernia:  None appreciated  Skin  Inspection:  Grossly normal   Breasts: Examined lying and sitting.     Right: Without masses, retractions, discharge or axillary adenopathy.     Left: Without masses, retractions, discharge or axillary adenopathy. Gentitourinary   Inguinal/mons:  Normal without inguinal adenopathy  External genitalia:  Normal  BUS/Urethra/Skene's glands:  Normal  Vagina:  Normal  Cervix:  Normal IUD strings visible, grasped with ring forcep removed intact shown to patient and discarded.  Uterus:  normal in size, shape and contour.  Midline and mobile  Adnexa/parametria:     Rt: Without masses or tenderness.   Lt: Without masses or tenderness.  Anus and perineum: Normal  Digital rectal exam: Normal sphincter tone  without palpated masses or tenderness  Assessment/Plan:  61 y.o. S WF G3 P2  for annual exam with no complaints.  09/2011 Mirena IUD removed Primary care manages labs and DEXA HSV-1 rare outbreaks  Plan: Instructed to call if any bleeding. SBE's, continue annual screening mammogram, calcium rich diet, vitamin D 2000 daily encouraged. Home safety, fall prevention and importance of weightbearing exercise reviewed. Valtrex 500 twice daily when necessary. Prescription, proper use given and reviewed. Pap with HR HPV typing, new screening guidelines reviewed. Congratulated on 9 pound weight loss with diet and exercise.    [name] [facility], 3:43 PM 01/06/2017

## 2017-01-06 NOTE — Patient Instructions (Signed)

## 2017-01-06 NOTE — Addendum Note (Signed)
Addended by: Thurnell Garbe A on: 01/06/2017 04:04 PM   Modules accepted: Orders

## 2017-01-07 LAB — PAP, TP IMAGING W/ HPV RNA, RFLX HPV TYPE 16,18/45: HPV mRNA, High Risk: NOT DETECTED

## 2017-03-01 ENCOUNTER — Ambulatory Visit
Admission: RE | Admit: 2017-03-01 | Discharge: 2017-03-01 | Disposition: A | Discharge status: Home / Self Care | Payer: BLUE CROSS/BLUE SHIELD | Source: Ambulatory Visit | Attending: Acute Care | Admitting: Acute Care

## 2017-03-01 DIAGNOSIS — Z87891 Personal history of nicotine dependence: Secondary | ICD-10-CM

## 2017-03-10 ENCOUNTER — Other Ambulatory Visit: Payer: Self-pay | Admitting: Acute Care

## 2017-03-10 DIAGNOSIS — Z87891 Personal history of nicotine dependence: Secondary | ICD-10-CM

## 2018-02-08 ENCOUNTER — Other Ambulatory Visit: Payer: Self-pay | Admitting: Internal Medicine

## 2018-02-08 DIAGNOSIS — Z139 Encounter for screening, unspecified: Secondary | ICD-10-CM

## 2018-03-03 ENCOUNTER — Ambulatory Visit
Admission: RE | Admit: 2018-03-03 | Discharge: 2018-03-03 | Disposition: A | Discharge status: Home / Self Care | Payer: BLUE CROSS/BLUE SHIELD | Source: Ambulatory Visit | Attending: Acute Care | Admitting: Acute Care

## 2018-03-03 DIAGNOSIS — Z87891 Personal history of nicotine dependence: Secondary | ICD-10-CM

## 2018-03-04 ENCOUNTER — Ambulatory Visit: Payer: BLUE CROSS/BLUE SHIELD

## 2018-03-10 ENCOUNTER — Other Ambulatory Visit: Payer: Self-pay | Admitting: Acute Care

## 2018-03-10 DIAGNOSIS — Z122 Encounter for screening for malignant neoplasm of respiratory organs: Secondary | ICD-10-CM

## 2018-03-10 DIAGNOSIS — Z87891 Personal history of nicotine dependence: Secondary | ICD-10-CM

## 2018-03-22 ENCOUNTER — Ambulatory Visit: Payer: BLUE CROSS/BLUE SHIELD

## 2018-03-25 ENCOUNTER — Ambulatory Visit
Admission: RE | Admit: 2018-03-25 | Discharge: 2018-03-25 | Disposition: A | Discharge status: Home / Self Care | Payer: BLUE CROSS/BLUE SHIELD | Source: Ambulatory Visit | Attending: Internal Medicine | Admitting: Internal Medicine

## 2018-03-25 DIAGNOSIS — Z139 Encounter for screening, unspecified: Secondary | ICD-10-CM

## 2018-06-01 ENCOUNTER — Ambulatory Visit (INDEPENDENT_AMBULATORY_CARE_PROVIDER_SITE_OTHER): Payer: BLUE CROSS/BLUE SHIELD

## 2018-06-01 ENCOUNTER — Ambulatory Visit (INDEPENDENT_AMBULATORY_CARE_PROVIDER_SITE_OTHER): Payer: BLUE CROSS/BLUE SHIELD | Admitting: Physician Assistant

## 2018-06-01 ENCOUNTER — Encounter (INDEPENDENT_AMBULATORY_CARE_PROVIDER_SITE_OTHER): Payer: Self-pay | Admitting: Physician Assistant

## 2018-06-01 DIAGNOSIS — M25562 Pain in left knee: Secondary | ICD-10-CM

## 2018-06-01 MED ORDER — METHYLPREDNISOLONE ACETATE 40 MG/ML IJ SUSP
40.0000 mg | INTRAMUSCULAR | Status: AC | PRN
  Administered 2018-06-01: 40 mg via INTRA_ARTICULAR

## 2018-06-01 MED ORDER — LIDOCAINE HCL 1 % IJ SOLN
3.0000 mL | INTRAMUSCULAR | Status: AC | PRN
  Administered 2018-06-01: 3 mL
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
## 2018-06-15 ENCOUNTER — Ambulatory Visit (INDEPENDENT_AMBULATORY_CARE_PROVIDER_SITE_OTHER): Payer: BLUE CROSS/BLUE SHIELD | Admitting: Physician Assistant

## 2019-01-11 ENCOUNTER — Encounter: Payer: Self-pay | Admitting: Women's Health

## 2019-01-11 ENCOUNTER — Ambulatory Visit: Payer: BLUE CROSS/BLUE SHIELD | Admitting: Women's Health

## 2019-01-11 VITALS — BP 128/80 | Ht 64.0 in | Wt 153.0 lb

## 2019-01-11 DIAGNOSIS — E7800 Pure hypercholesterolemia, unspecified: Secondary | ICD-10-CM | POA: Diagnosis not present

## 2019-01-11 DIAGNOSIS — Z01419 Encounter for gynecological examination (general) (routine) without abnormal findings: Secondary | ICD-10-CM | POA: Diagnosis not present

## 2019-01-11 NOTE — Progress Notes (Signed)
Karina Molina 04/18/1956 [id number]    History:    Presents for annual exam.  Postmenopausal on no HRT with no bleeding.  Normal Pap and mammogram history.  Hypercholesteremia primary care manages.  2016 benign colon polyp 5-year follow-up.  Reports normal DEXA at primary care and has follow-up dexa scheduled.  HSV 1 rare outbreaks.  Not sexually active in years.  Past medical history, past surgical history, family history and social history were all reviewed and documented in the EPIC chart.  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  2 children one lives in [place] 1 in [place] both doing well.  Father hypercholesteremia.  ROS:  A ROS was performed and pertinent positives and negatives are included.  Exam:  Vitals:   01/11/19 0824  BP: 128/80  Weight: 153 lb (69.4 kg)  Height: 5\' 4"  (1.626 m)   Body mass index is 26.26 kg/m.   General appearance:  Normal Thyroid:  Symmetrical, normal in size, without palpable masses or nodularity. Respiratory  Auscultation:  Clear without wheezing or rhonchi Cardiovascular  Auscultation:  Regular rate, without rubs, murmurs or gallops  Edema/varicosities:  Not grossly evident Abdominal  Soft,nontender, without masses, guarding or rebound.  Liver/spleen:  No organomegaly noted  Hernia:  None appreciated  Skin  Inspection:  Grossly normal   Breasts: Examined lying and sitting.     Right: Without masses, retractions, discharge or axillary adenopathy.     Left: Without masses, retractions, discharge or axillary adenopathy. Gentitourinary   Inguinal/mons:  Normal without inguinal adenopathy  External genitalia:  Normal  BUS/Urethra/Skene's glands:  Normal  Vagina: Atrophic  Cervix:  Normal  Uterus:   normal in size, shape and contour.  Midline and mobile  Adnexa/parametria:     Rt: Without masses or tenderness.   Lt: Without masses or tenderness.  Anus and perineum: Normal  Digital rectal exam: Normal sphincter tone without palpated masses or  tenderness  Assessment/Plan:  63 y.o. D WF G3, P2 for annual exam with no complaints.    Postmenopausal no HRT no bleeding Hypercholesteremia-primary care manages labs and meds HSV 1 rare outbreaks DEXA -Primary care  Plan: SBEs, continue annual 3D screening mammogram, calcium rich foods, vitamin D 2000 daily encouraged.  Reviewed importance of weightbearing and balance type exercise, yoga encouraged.  Shingrix vaccine discussed and encouraged.  Reviewed vaginal lubricant with intercourse and condoms, vagina dry.  Pap normal 2018, new screening guidelines reviewed.   Rafael Capo, 8:28 AM 01/11/2019

## 2019-01-11 NOTE — Patient Instructions (Signed)
Shingrex vaccine to series shingles prevention shot can get at a pharmacy  Health Maintenance for Postmenopausal Women Menopause is a normal process in which your reproductive ability comes to an end. This process happens gradually over a span of months to years, usually between the ages of 34 and 45. Menopause is complete when you have missed 12 consecutive menstrual periods. It is important to talk with your health care provider about some of the most common conditions that affect postmenopausal women, such as heart disease, cancer, and bone loss (osteoporosis). Adopting a healthy lifestyle and getting preventive care can help to promote your health and wellness. Those actions can also lower your chances of developing some of these common conditions. What should I know about menopause? During menopause, you may experience a number of symptoms, such as:  Moderate-to-severe hot flashes.  Night sweats.  Decrease in sex drive.  Mood swings.  Headaches.  Tiredness.  Irritability.  Memory problems.  Insomnia. Choosing to treat or not to treat menopausal changes is an individual decision that you make with your health care provider. What should I know about hormone replacement therapy and supplements? Hormone therapy products are effective for treating symptoms that are associated with menopause, such as hot flashes and night sweats. Hormone replacement carries certain risks, especially as you become older. If you are thinking about using estrogen or estrogen with progestin treatments, discuss the benefits and risks with your health care provider. What should I know about heart disease and stroke? Heart disease, heart attack, and stroke become more likely as you age. This may be due, in part, to the hormonal changes that your body experiences during menopause. These can affect how your body processes dietary fats, triglycerides, and cholesterol. Heart attack and stroke are both medical  emergencies. There are many things that you can do to help prevent heart disease and stroke:  Have your blood pressure checked at least every 1-2 years. High blood pressure causes heart disease and increases the risk of stroke.  If you are 3-33 years old, ask your health care provider if you should take aspirin to prevent a heart attack or a stroke.  Do not use any tobacco products, including cigarettes, chewing tobacco, or electronic cigarettes. If you need help quitting, ask your health care provider.  It is important to eat a healthy diet and maintain a healthy weight. ? Be sure to include plenty of vegetables, fruits, low-fat dairy products, and lean protein. ? Avoid eating foods that are high in solid fats, added sugars, or salt (sodium).  Get regular exercise. This is one of the most important things that you can do for your health. ? Try to exercise for at least 150 minutes each week. The type of exercise that you do should increase your heart rate and make you sweat. This is known as moderate-intensity exercise. ? Try to do strengthening exercises at least twice each week. Do these in addition to the moderate-intensity exercise.  Know your numbers.Ask your health care provider to check your cholesterol and your blood glucose. Continue to have your blood tested as directed by your health care provider.  What should I know about cancer screening? There are several types of cancer. Take the following steps to reduce your risk and to catch any cancer development as early as possible. Breast Cancer  Practice breast self-awareness. ? This means understanding how your breasts normally appear and feel. ? It also means doing regular breast self-exams. Let your health care provider know  about any changes, no matter how small.  If you are 10 or older, have a clinician do a breast exam (clinical breast exam or CBE) every year. Depending on your age, family history, and medical history, it  may be recommended that you also have a yearly breast X-ray (mammogram).  If you have a family history of breast cancer, talk with your health care provider about genetic screening.  If you are at high risk for breast cancer, talk with your health care provider about having an MRI and a mammogram every year.  Breast cancer (BRCA) gene test is recommended for women who have family members with BRCA-related cancers. Results of the assessment will determine the need for genetic counseling and BRCA1 and for BRCA2 testing. BRCA-related cancers include these types: ? Breast. This occurs in males or females. ? Ovarian. ? Tubal. This may also be called fallopian tube cancer. ? Cancer of the abdominal or pelvic lining (peritoneal cancer). ? Prostate. ? Pancreatic. Cervical, Uterine, and Ovarian Cancer Your health care provider may recommend that you be screened regularly for cancer of the pelvic organs. These include your ovaries, uterus, and vagina. This screening involves a pelvic exam, which includes checking for microscopic changes to the surface of your cervix (Pap test).  For women ages 21-65, health care providers may recommend a pelvic exam and a Pap test every three years. For women ages 43-65, they may recommend the Pap test and pelvic exam, combined with testing for human papilloma virus (HPV), every five years. Some types of HPV increase your risk of cervical cancer. Testing for HPV may also be done on women of any age who have unclear Pap test results.  Other health care providers may not recommend any screening for nonpregnant women who are considered low risk for pelvic cancer and have no symptoms. Ask your health care provider if a screening pelvic exam is right for you.  If you have had past treatment for cervical cancer or a condition that could lead to cancer, you need Pap tests and screening for cancer for at least 20 years after your treatment. If Pap tests have been discontinued for  you, your risk factors (such as having a new sexual partner) need to be reassessed to determine if you should start having screenings again. Some women have medical problems that increase the chance of getting cervical cancer. In these cases, your health care provider may recommend that you have screening and Pap tests more often.  If you have a family history of uterine cancer or ovarian cancer, talk with your health care provider about genetic screening.  If you have vaginal bleeding after reaching menopause, tell your health care provider.  There are currently no reliable tests available to screen for ovarian cancer. Lung Cancer Lung cancer screening is recommended for adults 82-82 years old who are at high risk for lung cancer because of a history of smoking. A yearly low-dose CT scan of the lungs is recommended if you:  Currently smoke.  Have a history of at least 30 pack-years of smoking and you currently smoke or have quit within the past 15 years. A pack-year is smoking an average of one pack of cigarettes per day for one year. Yearly screening should:  Continue until it has been 15 years since you quit.  Stop if you develop a health problem that would prevent you from having lung cancer treatment. Colorectal Cancer  This type of cancer can be detected and can often be prevented.  Routine colorectal cancer screening usually begins at age 50 and continues through age 75.  If you have risk factors for colon cancer, your health care provider may recommend that you be screened at an earlier age.  If you have a family history of colorectal cancer, talk with your health care provider about genetic screening.  Your health care provider may also recommend using home test kits to check for hidden blood in your stool.  A small camera at the end of a tube can be used to examine your colon directly (sigmoidoscopy or colonoscopy). This is done to check for the earliest forms of colorectal  cancer.  Direct examination of the colon should be repeated every 5-10 years until age 75. However, if early forms of precancerous polyps or small growths are found or if you have a family history or genetic risk for colorectal cancer, you may need to be screened more often. Skin Cancer  Check your skin from head to toe regularly.  Monitor any moles. Be sure to tell your health care provider: ? About any new moles or changes in moles, especially if there is a change in a mole's shape or color. ? If you have a mole that is larger than the size of a pencil eraser.  If any of your family members has a history of skin cancer, especially at a Kalianne Fetting age, talk with your health care provider about genetic screening.  Always use sunscreen. Apply sunscreen liberally and repeatedly throughout the day.  Whenever you are outside, protect yourself by wearing long sleeves, pants, a wide-brimmed hat, and sunglasses. What should I know about osteoporosis? Osteoporosis is a condition in which bone destruction happens more quickly than new bone creation. After menopause, you may be at an increased risk for osteoporosis. To help prevent osteoporosis or the bone fractures that can happen because of osteoporosis, the following is recommended:  If you are 19-50 years old, get at least 1,000 mg of calcium and at least 600 mg of vitamin D per day.  If you are older than age 50 but younger than age 70, get at least 1,200 mg of calcium and at least 600 mg of vitamin D per day.  If you are older than age 70, get at least 1,200 mg of calcium and at least 800 mg of vitamin D per day. Smoking and excessive alcohol intake increase the risk of osteoporosis. Eat foods that are rich in calcium and vitamin D, and do weight-bearing exercises several times each week as directed by your health care provider. What should I know about how menopause affects my mental health? Depression may occur at any age, but it is more common as  you become older. Common symptoms of depression include:  Low or sad mood.  Changes in sleep patterns.  Changes in appetite or eating patterns.  Feeling an overall lack of motivation or enjoyment of activities that you previously enjoyed.  Frequent crying spells. Talk with your health care provider if you think that you are experiencing depression. What should I know about immunizations? It is important that you get and maintain your immunizations. These include:  Tetanus, diphtheria, and pertussis (Tdap) booster vaccine.  Influenza every year before the flu season begins.  Pneumonia vaccine.  Shingles vaccine. Your health care provider may also recommend other immunizations. This information is not intended to replace advice given to you by your health care provider. Make sure you discuss any questions you have with your health care provider. Document   Released: 12/25/2005 Document Revised: 05/22/2016 Document Reviewed: 08/06/2015 Elsevier Interactive Patient Education  2019 [employer].

## 2019-03-01 ENCOUNTER — Other Ambulatory Visit: Payer: Self-pay | Admitting: Internal Medicine

## 2019-03-01 DIAGNOSIS — F17201 Nicotine dependence, unspecified, in remission: Secondary | ICD-10-CM

## 2019-04-24 ENCOUNTER — Ambulatory Visit
Admission: RE | Admit: 2019-04-24 | Discharge: 2019-04-24 | Disposition: A | Discharge status: Home / Self Care | Payer: BLUE CROSS/BLUE SHIELD | Source: Ambulatory Visit | Attending: Internal Medicine | Admitting: Internal Medicine

## 2019-04-24 ENCOUNTER — Other Ambulatory Visit: Payer: Self-pay

## 2019-04-24 DIAGNOSIS — F17201 Nicotine dependence, unspecified, in remission: Secondary | ICD-10-CM

## 2019-04-27 ENCOUNTER — Other Ambulatory Visit: Payer: Self-pay | Admitting: Acute Care

## 2019-04-27 DIAGNOSIS — Z87891 Personal history of nicotine dependence: Secondary | ICD-10-CM

## 2019-04-27 DIAGNOSIS — Z122 Encounter for screening for malignant neoplasm of respiratory organs: Secondary | ICD-10-CM

## 2019-05-26 ENCOUNTER — Other Ambulatory Visit: Payer: Self-pay | Admitting: Internal Medicine

## 2019-05-26 DIAGNOSIS — Z1231 Encounter for screening mammogram for malignant neoplasm of breast: Secondary | ICD-10-CM

## 2019-07-12 ENCOUNTER — Other Ambulatory Visit: Payer: Self-pay

## 2019-07-12 ENCOUNTER — Ambulatory Visit
Admission: RE | Admit: 2019-07-12 | Discharge: 2019-07-12 | Disposition: A | Discharge status: Home / Self Care | Payer: BLUE CROSS/BLUE SHIELD | Source: Ambulatory Visit | Attending: Internal Medicine | Admitting: Internal Medicine

## 2019-07-12 DIAGNOSIS — Z1231 Encounter for screening mammogram for malignant neoplasm of breast: Secondary | ICD-10-CM

## 2019-11-09 IMAGING — CT CT CHEST LUNG CANCER SCREENING LOW DOSE W/O CM
1 of 3 series · 10 of 30 positions shown, 13 images · non-contrast
Comparison: 03/01/2017.

CLINICAL DATA: Former smoker, quit 12 years ago, 30 pack-year
history, lung cancer screening.

EXAM:
CT CHEST WITHOUT CONTRAST LOW-DOSE FOR LUNG CANCER SCREENING
TECHNIQUE: Multidetector CT imaging of the chest was performed following the
standard protocol without IV contrast.

[ct lung segmentation data · axial · 0.69mm/px · z∈[-292,-292]mm · 10 of 273 frames shown]
[frame 1/273  mediastinal]
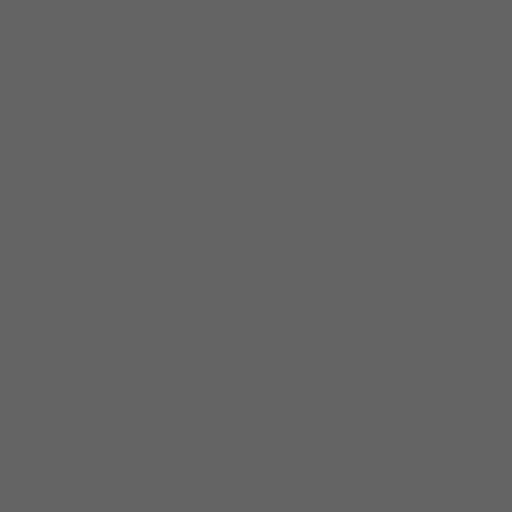
[frame 1/273  lung]
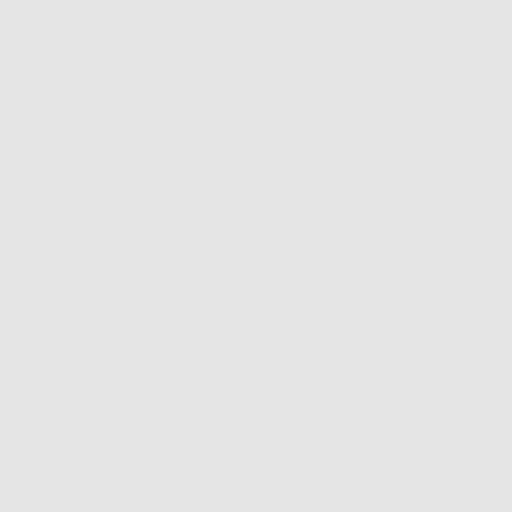
[frame 31/273  lung]
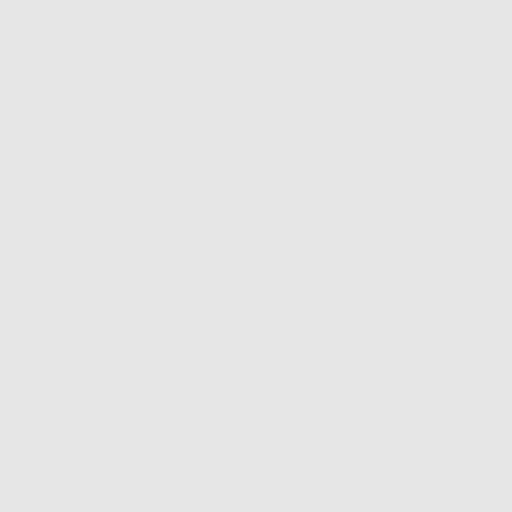
[frame 61/273  lung]
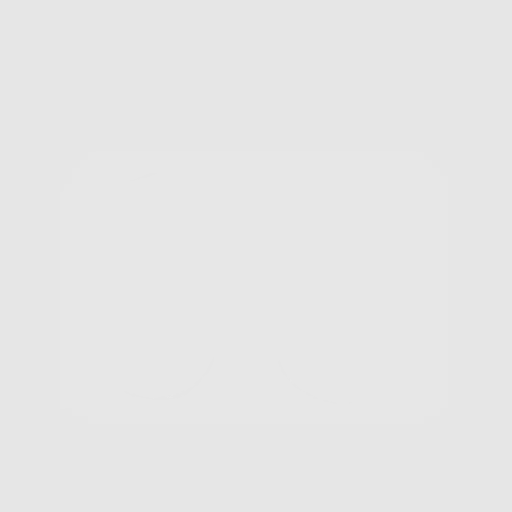
[frame 91/273  lung]
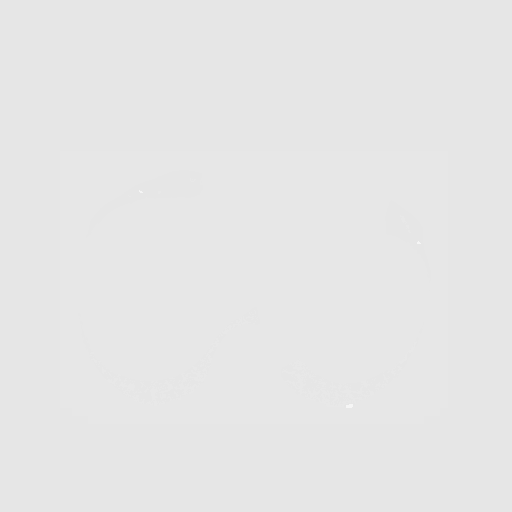
[frame 121/273  mediastinal]
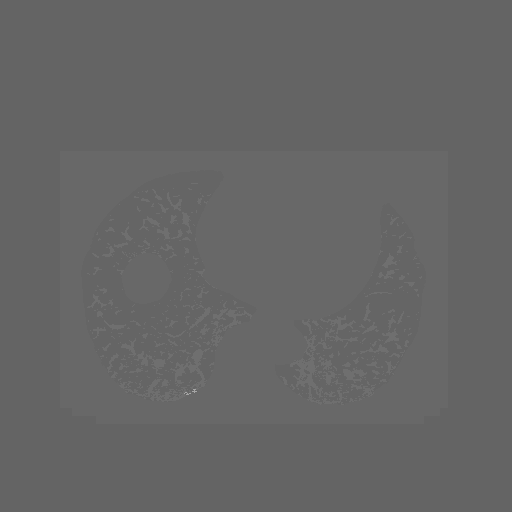
[frame 121/273  lung]
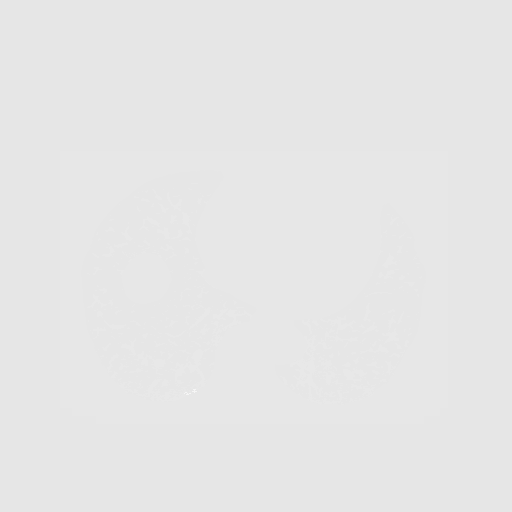
[frame 152/273  lung]
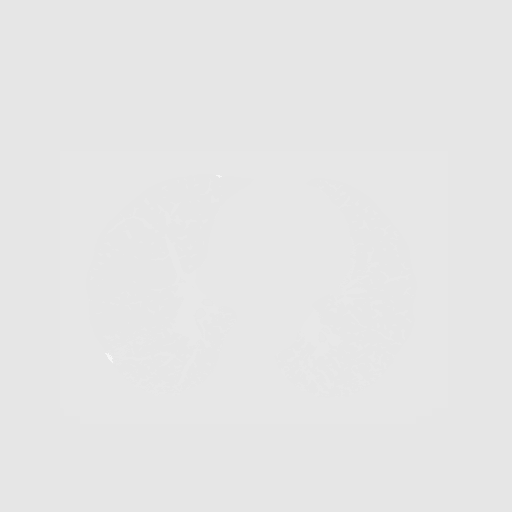
[frame 182/273  lung]
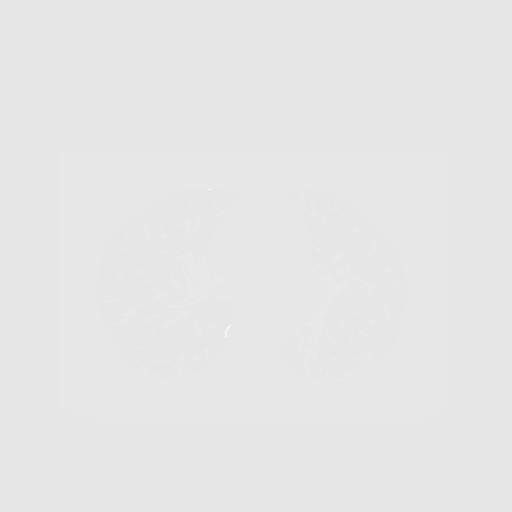
[frame 212/273  lung]
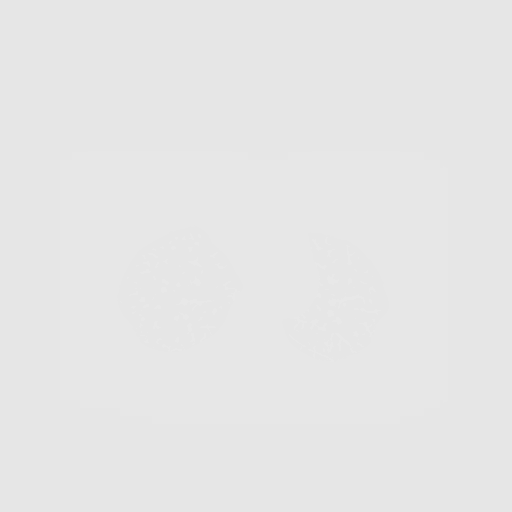
[frame 242/273  mediastinal]
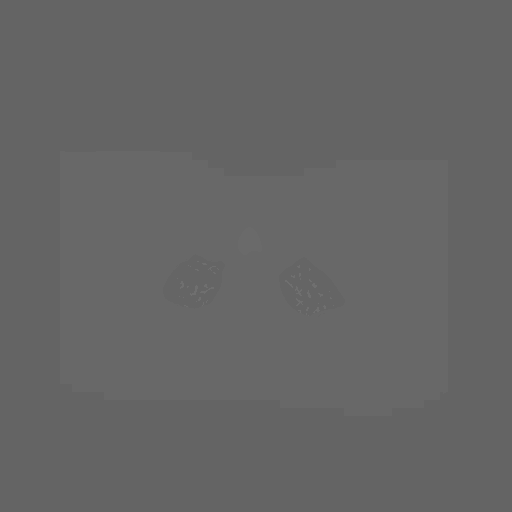
[frame 242/273  lung]
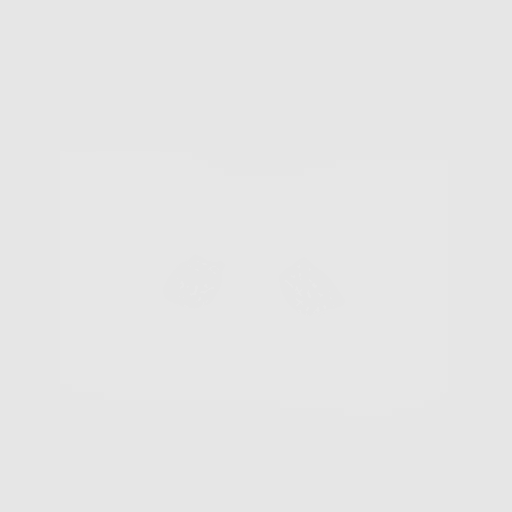
[frame 273/273  lung]
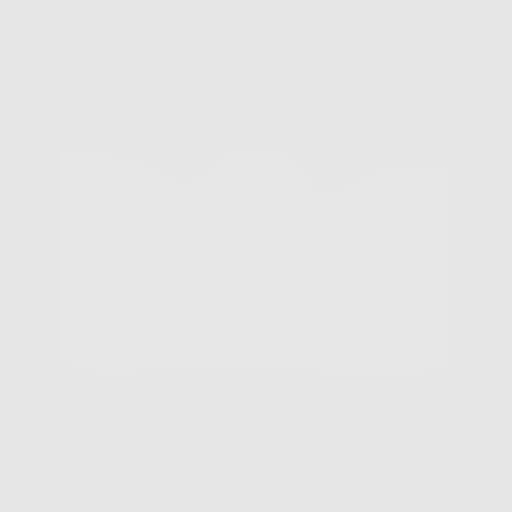

[10 of 30 positions shown; findings below may reference images not displayed]

FINDINGS: Cardiovascular: Atherosclerotic calcification of the arterial
vasculature. Heart is at the upper limits of normal in size. No
pericardial effusion.

Mediastinum/Nodes: Subcentimeter low-attenuation lesions in the
thyroid, as before. No pathologically enlarged mediastinal or
axillary lymph nodes. Hilar regions are difficult to evaluate
without IV contrast but appear grossly unremarkable. Esophagus is
grossly unremarkable.

Lungs/Pleura: Pulmonary nodules measure 4.5 mm or less in size. No
new pulmonary nodules. No pleural fluid. Airway is unremarkable.

Upper Abdomen: Visualized portions of the liver, gallbladder,
adrenal glands, kidneys, spleen, pancreas, stomach and bowel are
grossly unremarkable. No upper abdominal adenopathy.

Musculoskeletal: Degenerative changes in the spine. No worrisome
lytic or sclerotic lesions.
IMPRESSION: 1. Lung-RADS 2, benign appearance or behavior. Continue annual
screening with low-dose chest CT without contrast in 12 months.
2.  Aortic atherosclerosis ([id number]-170.0).

## 2020-01-12 ENCOUNTER — Ambulatory Visit: Payer: BLUE CROSS/BLUE SHIELD | Admitting: Cardiology

## 2020-01-12 ENCOUNTER — Encounter: Payer: Self-pay | Admitting: Cardiology

## 2020-01-12 ENCOUNTER — Other Ambulatory Visit: Payer: Self-pay

## 2020-01-12 VITALS — BP 149/91 | HR 90 | Temp 98.4°F | Ht 64.0 in | Wt 158.0 lb

## 2020-01-12 DIAGNOSIS — I499 Cardiac arrhythmia, unspecified: Secondary | ICD-10-CM | POA: Insufficient documentation

## 2020-01-12 DIAGNOSIS — R002 Palpitations: Secondary | ICD-10-CM

## 2020-01-12 DIAGNOSIS — I700 Atherosclerosis of aorta: Secondary | ICD-10-CM | POA: Insufficient documentation

## 2020-01-12 DIAGNOSIS — Z8249 Family history of ischemic heart disease and other diseases of the circulatory system: Secondary | ICD-10-CM | POA: Insufficient documentation

## 2020-01-12 NOTE — Progress Notes (Signed)
Patient referred by Marton Redwood, MD for palpitations, aortic atherosclerosis  Subjective:   Karina Molina, female    DOB: [birth year], 64 y.o.   MRN: [id number]   Chief Complaint  Patient presents with  . Palpitations  . Hyperlipidemia  . New Patient (Initial Visit)     HPI  64 y.o. Caucasian female with family history of early coronary artery disease, referred for aortic atherosclerosis and palpitations.  Patient had an episode of irregular heart beat and palpitations last week. Concurrently, she also had some right hand numbness, and subconjunctival hemorrhage. She denies chest pain, shortness of breath, palpitations, leg edema, orthopnea, PND, TIA/syncope. 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 She is on statin.  Blood pressure is elevated today.  Usually, it is much lower.  Fam h/o CAD, father w/MIsX2 in 55s, mother in mid 66s.   Past Medical History:  Diagnosis Date  . IUD 09/2011   MIRENA     Past Surgical History:  Procedure Laterality Date  . DENTAL SURGERY     age 5  . INTRAUTERINE DEVICE INSERTION  10/12/11   Mirena     Social History   Tobacco Use  Smoking Status Former Smoker  . Quit date: 11/16/2005  . Years since quitting: 14.1  Smokeless Tobacco Never Used    Social History   Substance and Sexual Activity  Alcohol Use Yes  . Alcohol/week: 7.0 standard drinks  . Types: 7 Glasses of wine per week     Family History  Problem Relation Age of Onset  . Diabetes Father   . Heart disease Father   . Diabetes Paternal Grandmother   . Colon cancer Neg Hx      Current Outpatient Medications on File Prior to Visit  Medication Sig Dispense Refill  . cyclobenzaprine (FLEXERIL) 10 MG tablet Take 10 mg by mouth 3 (three) times daily as needed for muscle spasms.    . Multiple Vitamin (MULTIVITAMIN) tablet Take 1 tablet by mouth daily.    . naproxen (NAPROSYN) 500 MG tablet Take 500 mg by mouth 2 (two) times daily  with a meal.    . rosuvastatin (CRESTOR) 10 MG tablet TK 1 T PO QD  9  . valACYclovir (VALTREX) 500 MG tablet Take 1 twice daily 3-5 days as needed 21 tablet 6   No current facility-administered medications on file prior to visit.    Cardiovascular and other pertinent studies:  CT Chest 04/24/2019: 1. Lung-RADS 1, negative. Continue annual screening with low-dose chest CT without contrast in 12 months. 2.  Aortic Atherosclerois (ICD10-170.0)   EKG 01/12/2020: Sinus rhythm 71 bpm. Normal EKG.   Recent labs: Not available   Review of Systems  HENT:       Subconjunctival hemorrhage, now resolved   Cardiovascular: Positive for palpitations. Negative for chest pain, dyspnea on exertion, leg swelling and syncope.         Vitals:   01/12/20 0911 01/12/20 0922  BP: (!) 151/92 (!) 149/91  Pulse: 80 90  Temp: 98.4 F (36.9 C)   SpO2: 100%     Body mass index is 27.12 kg/m. Filed Weights   01/12/20 0911  Weight: 158 lb (71.7 kg)     Objective:   Physical Exam  Constitutional: She appears well-developed and well-nourished.  Neck: No JVD present.  Cardiovascular: Normal rate, regular rhythm, normal heart sounds and intact distal pulses.  No murmur heard. Pulmonary/Chest: Effort normal and breath sounds normal. She  has no wheezes. She has no rales.  Musculoskeletal:        General: No edema.  Nursing note and vitals reviewed.          Assessment & Recommendations:   64 y.o. Caucasian female with family history of early coronary artery disease, referred for aortic atherosclerosis and palpitations.  Palpitations: Low suspicion for malignant arrhythmia.  Recommend 4-week event monitor.  Elevated blood pressure without diagnosis of hypertension: Made diet and lifestyle recommendations.  Encouraged to check blood pressure regularly at home.  Remains elevated, could consider antihypertensive therapy.  Will check echocardiogram.  Aortic atherosclerosis: Continue  statin.  Cardiac stratification: Family history of coronary artery disease.  Patient does not have any symptoms.  She is already on statin.  I will check lipid panel for baseline assessment.  Further recommendations after above testing.   Thank you for referring the patient to [place]. Please feel free to contact with any questions.  Nigel Mormon, MD [facility] Pager: [phone] Office: [phone]

## 2020-01-12 NOTE — Patient Instructions (Signed)
Diet & Lifestyle recommendations:  Physical activity recommendation (The Physical Activity Guidelines for Americans. JAMA 2018;Nov 12) At least 150-300 minutes a week of moderate-intensity, or 75-150 minutes a week of vigorous-intensity aerobic physical activity, or an equivalent combination of moderate- and vigorous-intensity aerobic activity. Adults should perform muscle-strengthening activities on 2 or more days a week. Older adults should do multicomponent physical activity that includes balance training as well as aerobic and muscle-strengthening activities. Benefits of increased physical activity include lower risk of mortality including cardiovascular mortality, lower risk of cardiovascular events and associated risk factors (hypertension and diabetes), and lower risk of many cancers (including bladder, breast, colon, endometrium, esophagus, kidney, lung, and stomach). Additional improvments have been seen in cognition, risk of dementia, anxiety and depression, improved bone health, lower risk of falls, and associated injuries.  Dietary recommendation The 2019 ACC/AHA guidelines promote nutrition as a main fixture of cardiovascular wellness, with a recommendation for a varied diet of fruit, vegetables, fish, legumes, and whole grains (Class I), as well as recommendations to reduce sodium, cholesterol, processed meats, and refined sugars (Class IIa recommendation).10 Sodium intake, a topic of some controversy as of late, is recommended to be kept at 1,500 mg/day or less, far below the average daily intake in the [place] of 3,409 mg/day, and notably below that of previous US recommendations for <2,300mg/day.10,11 For those unable to reach 1,500 mg/day, they recommend at least a reduction of 1000 mg/day.  A Pesco-Mediterranean Diet With Intermittent Fasting: JACC Review Topic of the Week. J Am Coll Cardiol 2020;76:1484-1493 Pesco-Mediterranean diet, it is supplemented with extra-virgin olive oil (EVOO),  which is the principle fat source, along with moderate amounts of dairy (particularly yogurt and cheese) and eggs, as well as modest amounts of alcohol consumption (ideally red wine with the evening meal), but few red and processed meats.   DASH Eating Plan DASH stands for "Dietary Approaches to Stop Hypertension." The DASH eating plan is a healthy eating plan that has been shown to reduce high blood pressure (hypertension). It may also reduce your risk for type 2 diabetes, heart disease, and stroke. The DASH eating plan may also help with weight loss. What are tips for following this plan?  General guidelines  Avoid eating more than 2,300 mg (milligrams) of salt (sodium) a day. If you have hypertension, you may need to reduce your sodium intake to 1,500 mg a day.  Limit alcohol intake to no more than 1 drink a day for nonpregnant women and 2 drinks a day for men. One drink equals 12 oz of beer, 5 oz of wine, or 1 oz of hard liquor.  Work with your health care provider to maintain a healthy body weight or to lose weight. Ask what an ideal weight is for you.  Get at least 30 minutes of exercise that causes your heart to beat faster (aerobic exercise) most days of the week. Activities may include walking, swimming, or biking.  Work with your health care provider or diet and nutrition specialist (dietitian) to adjust your eating plan to your individual calorie needs. Reading food labels   Check food labels for the amount of sodium per serving. Choose foods with less than 5 percent of the Daily Value of sodium. Generally, foods with less than 300 mg of sodium per serving fit into this eating plan.  To find whole grains, look for the word "whole" as the first word in the ingredient list. Shopping  Buy products labeled as "low-sodium" or "no salt added."    Buy fresh foods. Avoid canned foods and premade or frozen meals. Cooking  Avoid adding salt when cooking. Use salt-free seasonings or  herbs instead of table salt or sea salt. Check with your health care provider or pharmacist before using salt substitutes.  Do not fry foods. Cook foods using healthy methods such as baking, boiling, grilling, and broiling instead.  Cook with heart-healthy oils, such as olive, canola, soybean, or sunflower oil. Meal planning  Eat a balanced diet that includes: ? 5 or more servings of fruits and vegetables each day. At each meal, try to fill half of your plate with fruits and vegetables. ? Up to 6-8 servings of whole grains each day. ? Less than 6 oz of lean meat, poultry, or fish each day. A 3-oz serving of meat is about the same size as a deck of cards. One egg equals 1 oz. ? 2 servings of low-fat dairy each day. ? A serving of nuts, seeds, or beans 5 times each week. ? Heart-healthy fats. Healthy fats called Omega-3 fatty acids are found in foods such as flaxseeds and coldwater fish, like sardines, salmon, and mackerel.  Limit how much you eat of the following: ? Canned or prepackaged foods. ? Food that is high in trans fat, such as fried foods. ? Food that is high in saturated fat, such as fatty meat. ? Sweets, desserts, sugary drinks, and other foods with added sugar. ? Full-fat dairy products.  Do not salt foods before eating.  Try to eat at least 2 vegetarian meals each week.  Eat more home-cooked food and less restaurant, buffet, and fast food.  When eating at a restaurant, ask that your food be prepared with less salt or no salt, if possible. What foods are recommended? The items listed may not be a complete list. Talk with your dietitian about what dietary choices are best for you. Grains Whole-grain or whole-wheat bread. Whole-grain or whole-wheat pasta. Brown rice. Oatmeal. Quinoa. Bulgur. Whole-grain and low-sodium cereals. Pita bread. Low-fat, low-sodium crackers. Whole-wheat flour tortillas. Vegetables Fresh or frozen vegetables (raw, steamed, roasted, or grilled).  Low-sodium or reduced-sodium tomato and vegetable juice. Low-sodium or reduced-sodium tomato sauce and tomato paste. Low-sodium or reduced-sodium canned vegetables. Fruits All fresh, dried, or frozen fruit. Canned fruit in natural juice (without added sugar). Meat and other protein foods Skinless chicken or turkey. Ground chicken or turkey. Pork with fat trimmed off. Fish and seafood. Egg whites. Dried beans, peas, or lentils. Unsalted nuts, nut butters, and seeds. Unsalted canned beans. Lean cuts of beef with fat trimmed off. Low-sodium, lean deli meat. Dairy Low-fat (1%) or fat-free (skim) milk. Fat-free, low-fat, or reduced-fat cheeses. Nonfat, low-sodium ricotta or cottage cheese. Low-fat or nonfat yogurt. Low-fat, low-sodium cheese. Fats and oils Soft margarine without trans fats. Vegetable oil. Low-fat, reduced-fat, or light mayonnaise and salad dressings (reduced-sodium). Canola, safflower, olive, soybean, and sunflower oils. Avocado. Seasoning and other foods Herbs. Spices. Seasoning mixes without salt. Unsalted popcorn and pretzels. Fat-free sweets. What foods are not recommended? The items listed may not be a complete list. Talk with your dietitian about what dietary choices are best for you. Grains Baked goods made with fat, such as croissants, muffins, or some breads. Dry pasta or rice meal packs. Vegetables Creamed or fried vegetables. Vegetables in a cheese sauce. Regular canned vegetables (not low-sodium or reduced-sodium). Regular canned tomato sauce and paste (not low-sodium or reduced-sodium). Regular tomato and vegetable juice (not low-sodium or reduced-sodium). Pickles. Olives. Fruits Canned fruit in a   light or heavy syrup. Fried fruit. Fruit in cream or butter sauce. Meat and other protein foods Fatty cuts of meat. Ribs. Fried meat. Bacon. Sausage. Bologna and other processed lunch meats. Salami. Fatback. Hotdogs. Bratwurst. Salted nuts and seeds. Canned beans with added  salt. Canned or smoked fish. Whole eggs or egg yolks. Chicken or turkey with skin. Dairy Whole or 2% milk, cream, and half-and-half. Whole or full-fat cream cheese. Whole-fat or sweetened yogurt. Full-fat cheese. Nondairy creamers. Whipped toppings. Processed cheese and cheese spreads. Fats and oils Butter. Stick margarine. Lard. Shortening. Ghee. Bacon fat. Tropical oils, such as coconut, palm kernel, or palm oil. Seasoning and other foods Salted popcorn and pretzels. Onion salt, garlic salt, seasoned salt, table salt, and sea salt. Worcestershire sauce. Tartar sauce. Barbecue sauce. Teriyaki sauce. Soy sauce, including reduced-sodium. Steak sauce. Canned and packaged gravies. Fish sauce. Oyster sauce. Cocktail sauce. Horseradish that you find on the shelf. Ketchup. Mustard. Meat flavorings and tenderizers. Bouillon cubes. Hot sauce and Tabasco sauce. Premade or packaged marinades. Premade or packaged taco seasonings. Relishes. Regular salad dressings. Where to find more information:  National Heart, Lung, and Blood Institute: www.nhlbi.nih.gov  American Heart Association: www.heart.org Summary  The DASH eating plan is a healthy eating plan that has been shown to reduce high blood pressure (hypertension). It may also reduce your risk for type 2 diabetes, heart disease, and stroke.  With the DASH eating plan, you should limit salt (sodium) intake to 2,300 mg a day. If you have hypertension, you may need to reduce your sodium intake to 1,500 mg a day.  When on the DASH eating plan, aim to eat more fresh fruits and vegetables, whole grains, lean proteins, low-fat dairy, and heart-healthy fats.  Work with your health care provider or diet and nutrition specialist (dietitian) to adjust your eating plan to your individual calorie needs. This information is not intended to replace advice given to you by your health care provider. Make sure you discuss any questions you have with your health care  provider. Document Revised: 10/15/2017 Document Reviewed: 10/26/2016 Elsevier Patient Education  2020 Elsevier Inc.  

## 2020-01-17 ENCOUNTER — Ambulatory Visit: Payer: BLUE CROSS/BLUE SHIELD

## 2020-01-17 ENCOUNTER — Other Ambulatory Visit: Payer: Self-pay

## 2020-01-17 DIAGNOSIS — I499 Cardiac arrhythmia, unspecified: Secondary | ICD-10-CM

## 2020-01-18 LAB — LIPOPROTEIN A (LPA): Lipoprotein (a): 56.5 nmol/L (ref <75.0)

## 2020-02-06 ENCOUNTER — Telehealth: Payer: Self-pay

## 2020-02-06 NOTE — Telephone Encounter (Signed)
Ok

## 2020-02-06 NOTE — Telephone Encounter (Signed)
I received a message from [employer] that they have not received a transmission from the patient in a few days. I spoke to the patient and she said that she had a lot of issues with the monitor and did not realize she could call the company. She is out of town and will mail monitor next week. I believe she wore the monitor approximately 2.5 weeks.

## 2020-02-27 NOTE — Progress Notes (Signed)
Patient referred by Marton Redwood, MD for palpitations, aortic atherosclerosis  Subjective:   Karina Molina, female    DOB: 13-Jul-1956, 64 y.o.   MRN: [id number]   I connected with the patient on 02/28/2020 by a telephone call and verified that I am speaking with the correct person using two identifiers.     I offered the patient a video enabled application for a virtual visit. Unfortunately, this could not be accomplished due to technical difficulties/lack of video enabled phone/computer. I discussed the limitations of evaluation and management by telemedicine and the availability of in person appointments. The patient expressed understanding and agreed to proceed.   This visit type was conducted due to national recommendations for restrictions regarding the COVID-19 Pandemic (e.g. social distancing).  This format is felt to be most appropriate for this patient at this time.  All issues noted in this document were discussed and addressed.  No physical exam was performed (except for noted visual exam findings with Tele health visits).  The patient has consented to conduct a Tele health visit and understands insurance will be billed.    Chief Complaint  Patient presents with  . Palpitations  . Follow-up    6 week     HPI  64 y.o. Caucasian female with family history of early coronary artery disease, referred for aortic atherosclerosis and palpitations.  Patient is doing well. She denies chest pain, shortness of breath, palpitations, leg edema, orthopnea, PND, TIA/syncope. Reviewed recent lipid panel with the patient.    Initial consultation HPI 01/2020: Patient had an episode of irregular heart beat and palpitations last week. Concurrently, she also had some right hand numbness, and subconjunctival hemorrhage. She denies chest pain, shortness of breath, palpitations, leg edema, orthopnea, PND, TIA/syncope. 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 She is on  statin.  Blood pressure is elevated today.  Usually, it is much lower.  Fam h/o CAD, father w/MIsX2 in 28s, mother in mid 80s.    Current Outpatient Medications on File Prior to Visit  Medication Sig Dispense Refill  . Bioflavonoid Products (ESTER-C) TABS Take by mouth.    . Cholecalciferol (D3-1000) 25 MCG (1000 UT) tablet Take 1,000 Units by mouth daily.    . cyclobenzaprine (FLEXERIL) 10 MG tablet Take 10 mg by mouth 3 (three) times daily as needed for muscle spasms.    . fluticasone (FLONASE) 50 MCG/ACT nasal spray Place 1 spray into both nostrils 2 (two) times daily.    Marland Kitchen glucosamine-chondroitin 500-400 MG tablet Take 1 tablet by mouth 3 (three) times daily.    . montelukast (SINGULAIR) 10 MG tablet Take 10 mg by mouth at bedtime.    . Multiple Vitamin (MULTIVITAMIN) tablet Take 1 tablet by mouth daily.    . naproxen (NAPROSYN) 500 MG tablet Take 500 mg by mouth 2 (two) times daily with a meal.    . NON FORMULARY Take 1 capsule by mouth as needed. Theodoro Parma    . NON FORMULARY Do Terra Brand Brand Fish oil, Cellular Vitality, Alpha CRS , Mito Max, Micro Plex    . rosuvastatin (CRESTOR) 10 MG tablet TK 1 T PO QD  9  . Turmeric 1053 MG TABS Take by mouth.    . valACYclovir (VALTREX) 500 MG tablet Take 1 twice daily 3-5 days as needed 21 tablet 6  . vitamin A 3 MG (10000 UNITS) capsule Take 10,000 Units by mouth daily.    . vitamin B-12 (CYANOCOBALAMIN)  500 MCG tablet Take 500 mcg by mouth daily.     No current facility-administered medications on file prior to visit.    Cardiovascular and other pertinent studies:  Event monitor 01/17/2020 - 02/16/2020: Diagnostic time: 50%. This limits diagnostic sensitivity Dominant rhythm: Sinus. HR 53-139 bpm. Avg HR 76 bpm. No atrial fibrillation/atrial flutter/SVT/VT/high grade AV block, sinus pause >3sec noted. Symptoms reported: None   CT Chest 04/24/2019: 1. Lung-RADS 1, negative. Continue annual screening with low-dose chest CT without  contrast in 12 months. 2.  Aortic Atherosclerois (ICD10-170.0)   EKG 01/12/2020: Sinus rhythm 71 bpm. Normal EKG.   Recent labs: 02/26/2020: Glucose 103, BUN/Cr 15/0.8. EGFR 72. Na/K  HbA1C 5.4% Chol 182, TG 52, HDL 90, LDL 82 TSH 1.1 normal    Review of Systems  HENT:       Subconjunctival hemorrhage, now resolved   Cardiovascular: Positive for palpitations. Negative for chest pain, dyspnea on exertion, leg swelling and syncope.         Vitals:   02/28/20 1055  BP: (!) 159/92  Pulse: 92     Body mass index is 27.12 kg/m. Filed Weights   02/28/20 1055  Weight: 158 lb (71.7 kg)     Objective:   Physical Exam  Not performed. Telephone visit.         Assessment & Recommendations:   64 y.o. Caucasian female with family history of early coronary artery disease, referred for aortic atherosclerosis and palpitations.  Palpitations: No significant arrhythmia seen on monitor. Symptoms have resolved.  Elevated blood pressure without diagnosis of hypertension: Recommend diet and lifestyle recommendations.  Encouraged to check blood pressure regularly at home.  If remains elevated, could consider antihypertensive therapy.  Will check echocardiogram.  Aortic atherosclerosis: Continue statin. Lipid panel favorably controlled.  Cardiac stratification: Family history of coronary artery disease.  Patient does not have any symptoms.  She is already on statin.  I will see her on as needed basis.   Nigel Mormon, MD [facility] Pager: [phone] Office: [phone]

## 2020-02-28 ENCOUNTER — Encounter: Payer: Self-pay | Admitting: Cardiology

## 2020-02-28 ENCOUNTER — Telehealth: Payer: BLUE CROSS/BLUE SHIELD | Admitting: Cardiology

## 2020-02-28 ENCOUNTER — Other Ambulatory Visit: Payer: Self-pay

## 2020-02-28 VITALS — BP 134/71 | HR 99 | Ht 64.0 in | Wt 158.0 lb

## 2020-02-28 DIAGNOSIS — R002 Palpitations: Secondary | ICD-10-CM

## 2020-02-28 DIAGNOSIS — R030 Elevated blood-pressure reading, without diagnosis of hypertension: Secondary | ICD-10-CM | POA: Insufficient documentation

## 2020-03-01 ENCOUNTER — Other Ambulatory Visit: Payer: Self-pay

## 2020-03-01 ENCOUNTER — Other Ambulatory Visit: Payer: Self-pay | Admitting: Internal Medicine

## 2020-03-01 DIAGNOSIS — F17201 Nicotine dependence, unspecified, in remission: Secondary | ICD-10-CM

## 2020-04-29 ENCOUNTER — Other Ambulatory Visit: Payer: Self-pay

## 2020-04-29 ENCOUNTER — Telehealth: Payer: Self-pay | Admitting: Acute Care

## 2020-04-29 ENCOUNTER — Ambulatory Visit
Admission: RE | Admit: 2020-04-29 | Discharge: 2020-04-29 | Disposition: A | Discharge status: Home / Self Care | Payer: BLUE CROSS/BLUE SHIELD | Source: Ambulatory Visit | Attending: Internal Medicine | Admitting: Internal Medicine

## 2020-04-29 DIAGNOSIS — F17201 Nicotine dependence, unspecified, in remission: Secondary | ICD-10-CM

## 2020-04-29 NOTE — Telephone Encounter (Signed)
Please call patient and let them  know their  low dose Ct was read as a Lung RADS 1, negative study: no nodules or definitely benign nodules. Radiology recommendation is for a repeat LDCT in 12 months..Please let them  know we will order and schedule their  annual screening scan for 04/2021. Please let them  know there was notation of CAD on their  scan.  Please remind the patient  that this is a non-gated exam therefore degree or severity of disease  cannot be determined. Please have them  follow up with their PCP regarding potential risk factor modification, dietary therapy or pharmacologic therapy if clinically indicated. Pt.  is  currently on statin therapy. Please place order for annual  screening scan for  04/2021 and fax results to PCP. Thanks so much.  Langley Gauss, please call the patient. This scan was done by Dr. Brigitte Pulse. Can you call his office and see if he is going to follow this patient for screening. We need something in writing before we can remove the patient from our program. Thanks

## 2020-05-03 NOTE — Telephone Encounter (Signed)
Spoke with pt and she states that she was made aware of CT results from Dr Raul Del office. Pt has been quit smoking 15 years now and states that she will have Dr Brigitte Pulse order her future screening CT's through [place] and she will plan to pay out of pocket from them. Nothing further needed at this time.

## 2020-08-13 ENCOUNTER — Other Ambulatory Visit: Payer: Self-pay | Admitting: Cardiovascular Disease

## 2020-08-13 ENCOUNTER — Other Ambulatory Visit: Payer: Self-pay | Admitting: Internal Medicine

## 2020-08-13 DIAGNOSIS — Z1231 Encounter for screening mammogram for malignant neoplasm of breast: Secondary | ICD-10-CM

## 2020-08-26 ENCOUNTER — Other Ambulatory Visit: Payer: Self-pay

## 2020-08-26 ENCOUNTER — Ambulatory Visit
Admission: RE | Admit: 2020-08-26 | Discharge: 2020-08-26 | Disposition: A | Discharge status: Home / Self Care | Payer: BLUE CROSS/BLUE SHIELD | Source: Ambulatory Visit | Attending: Internal Medicine | Admitting: Internal Medicine

## 2020-08-26 DIAGNOSIS — Z1231 Encounter for screening mammogram for malignant neoplasm of breast: Secondary | ICD-10-CM

## 2021-04-11 ENCOUNTER — Other Ambulatory Visit: Payer: Self-pay | Admitting: Internal Medicine

## 2021-04-11 DIAGNOSIS — F17201 Nicotine dependence, unspecified, in remission: Secondary | ICD-10-CM

## 2021-05-07 ENCOUNTER — Ambulatory Visit
Admission: RE | Admit: 2021-05-07 | Discharge: 2021-05-07 | Disposition: A | Discharge status: Home / Self Care | Payer: BLUE CROSS/BLUE SHIELD | Source: Ambulatory Visit | Attending: Internal Medicine | Admitting: Internal Medicine

## 2021-05-07 DIAGNOSIS — F17201 Nicotine dependence, unspecified, in remission: Secondary | ICD-10-CM

## 2021-10-01 ENCOUNTER — Other Ambulatory Visit: Payer: Self-pay | Admitting: Internal Medicine

## 2021-10-01 DIAGNOSIS — Z1231 Encounter for screening mammogram for malignant neoplasm of breast: Secondary | ICD-10-CM

## 2021-11-06 ENCOUNTER — Ambulatory Visit
Admission: RE | Admit: 2021-11-06 | Discharge: 2021-11-06 | Disposition: A | Discharge status: Home / Self Care | Payer: BLUE CROSS/BLUE SHIELD | Source: Ambulatory Visit | Attending: Internal Medicine | Admitting: Internal Medicine

## 2021-11-06 DIAGNOSIS — Z1231 Encounter for screening mammogram for malignant neoplasm of breast: Secondary | ICD-10-CM

## 2021-12-02 ENCOUNTER — Encounter: Payer: Self-pay | Admitting: Nurse Practitioner

## 2021-12-02 ENCOUNTER — Other Ambulatory Visit: Payer: Self-pay

## 2021-12-02 ENCOUNTER — Ambulatory Visit (INDEPENDENT_AMBULATORY_CARE_PROVIDER_SITE_OTHER): Payer: BLUE CROSS/BLUE SHIELD | Admitting: Nurse Practitioner

## 2021-12-02 ENCOUNTER — Other Ambulatory Visit (HOSPITAL_COMMUNITY)
Admission: RE | Admit: 2021-12-02 | Discharge: 2021-12-02 | Disposition: A | Discharge status: Home / Self Care | Payer: BLUE CROSS/BLUE SHIELD | Source: Ambulatory Visit | Attending: Nurse Practitioner | Admitting: Nurse Practitioner

## 2021-12-02 VITALS — BP 130/80 | Ht 63.0 in | Wt 155.0 lb

## 2021-12-02 DIAGNOSIS — Z01419 Encounter for gynecological examination (general) (routine) without abnormal findings: Secondary | ICD-10-CM

## 2021-12-02 DIAGNOSIS — Z780 Asymptomatic menopausal state: Secondary | ICD-10-CM | POA: Diagnosis not present

## 2021-12-02 NOTE — Progress Notes (Signed)
° °  Karina Molina [birth year] [id number]   History:  66 y.o. G3P0102 presents for annual exam. Postmenopausal - no HRT, no bleeding. Normal pap history. HLD, bone health managed by PCP.  Gynecologic History Patient's last menstrual period was 11/23/2012.   Contraception/Family planning: post menopausal status Sexually active: No  Health Maintenance Last Pap: 01/06/2017. Results were: Normal Last mammogram: 11/06/2021. Results were: Normal Last colonoscopy: 04/03/2015. Results were: Benign polyp, 5-year recall Last Dexa: Less than 5 years ago. Normal per patient  Past medical history, past surgical history, family history and social history were all reviewed and documented in the EPIC chart. Single. 2 children, one in [place], one in [place]. 4 grandchildren ages 82-12, 2 boys and 2 girls. Works remote.  ROS:  A ROS was performed and pertinent positives and negatives are included.  Exam:  Vitals:   12/02/21 1011  BP: 130/80  Weight: 155 lb (70.3 kg)  Height: 5\' 3"  (1.6 m)   Body mass index is 27.46 kg/m.  General appearance:  Normal Thyroid:  Symmetrical, normal in size, without palpable masses or nodularity. Respiratory  Auscultation:  Clear without wheezing or rhonchi Cardiovascular  Auscultation:  Regular rate, without rubs, murmurs or gallops  Edema/varicosities:  Not grossly evident Abdominal  Soft,nontender, without masses, guarding or rebound.  Liver/spleen:  No organomegaly noted  Hernia:  None appreciated  Skin  Inspection:  Grossly normal Breasts: Examined lying and sitting.   Right: Without masses, retractions, nipple discharge or axillary adenopathy.   Left: Without masses, retractions, nipple discharge or axillary adenopathy. Genitourinary   Inguinal/mons:  Normal without inguinal adenopathy  External genitalia:  Normal appearing vulva with no masses, tenderness, or lesions  BUS/Urethra/Skene's glands:  Normal  Vagina:  Normal appearing with normal color  and discharge, no lesions  Cervix:  Normal appearing without discharge or lesions  Uterus:  Normal in size, shape and contour.  Midline and mobile, nontender  Adnexa/parametria:     Rt: Normal in size, without masses or tenderness.   Lt: Normal in size, without masses or tenderness.  Anus and perineum: Normal  Digital rectal exam: Normal sphincter tone without palpated masses or tenderness  Patient informed chaperone available to be present for breast and pelvic exam. Patient has requested no chaperone to be present. Patient has been advised what will be completed during breast and pelvic exam.   Assessment/Plan:  66 y.o. [id number] for annual exam.   Well female exam with routine gynecological exam - Plan: Cytology - PAP( [facility]). Education provided on SBEs, importance of preventative screenings, current guidelines, high calcium diet, regular exercise, and multivitamin daily.  Labs with PCP.   Postmenopausal - no HRT, no bleeding.   Screening for cervical cancer - Normal Pap history. Pap today.   Screening for breast cancer - Normal mammogram history.  Continue annual screenings.  Normal breast exam today.  Screening for colon cancer - 2016 colonoscopy. Overdue and plans to schedule soon.  Screening for osteoporosis - Normal bone density. Managed by PCP.   Return in 1 year for annual.      Tamela Gammon DNP, 10:30 AM 12/02/2021

## 2021-12-03 LAB — CYTOLOGY - PAP
Comment: NEGATIVE
Diagnosis: NEGATIVE
High risk HPV: NEGATIVE

## 2022-02-02 ENCOUNTER — Ambulatory Visit (AMBULATORY_SURGERY_CENTER): Payer: BLUE CROSS/BLUE SHIELD | Admitting: *Deleted

## 2022-02-02 ENCOUNTER — Other Ambulatory Visit: Payer: Self-pay

## 2022-02-02 VITALS — Ht 64.0 in | Wt 155.0 lb

## 2022-02-02 DIAGNOSIS — Z86010 Personal history of colonic polyps: Secondary | ICD-10-CM

## 2022-02-02 NOTE — Progress Notes (Signed)
No egg or soy allergy known to patient  ?No issues known to pt with past sedation with any surgeries or procedures ?Patient denies ever being told they had issues or difficulty with intubation  ?No FH of Malignant Hyperthermia ?Pt is not on diet pills ?Pt is not on  home 02  ?Pt is not on blood thinners  ?Pt denies issues with constipation  ?No A fib or A flutter ? ?Pt is not vaccinated  for Covid  ? ?Due to the COVID-19 pandemic we are asking patients to follow certain guidelines in PV and the [place]   ?Pt aware of COVID protocols and [facility] guidelines  ? ?PV completed over the phone. Pt verified name, DOB, address and insurance during PV today.  ?Pt mailed instruction packet with copy of consent form to read and not return, and instructions.  ?Pt encouraged to call with questions or issues.  ?If pt has My chart, procedure instructions sent via My Chart   ? ?Patient instructed of over the counter to purchase for prep. ?

## 2022-02-06 ENCOUNTER — Encounter: Payer: Self-pay | Admitting: Gastroenterology

## 2022-02-09 ENCOUNTER — Encounter: Payer: Self-pay | Admitting: Gastroenterology

## 2022-02-09 ENCOUNTER — Ambulatory Visit (AMBULATORY_SURGERY_CENTER): Payer: BLUE CROSS/BLUE SHIELD | Admitting: Gastroenterology

## 2022-02-09 ENCOUNTER — Other Ambulatory Visit: Payer: Self-pay

## 2022-02-09 VITALS — BP 130/82 | HR 65 | Temp 98.2°F | Resp 13 | Ht 64.0 in | Wt 155.0 lb

## 2022-02-09 DIAGNOSIS — K635 Polyp of colon: Secondary | ICD-10-CM | POA: Diagnosis not present

## 2022-02-09 DIAGNOSIS — Z86010 Personal history of colonic polyps: Secondary | ICD-10-CM | POA: Diagnosis not present

## 2022-02-09 DIAGNOSIS — D122 Benign neoplasm of ascending colon: Secondary | ICD-10-CM

## 2022-02-09 DIAGNOSIS — D120 Benign neoplasm of cecum: Secondary | ICD-10-CM

## 2022-02-09 MED ORDER — SODIUM CHLORIDE 0.9 % IV SOLN: 500.0000 mL | Freq: Once | INTRAVENOUS | Status: DC

## 2022-02-09 NOTE — Progress Notes (Signed)
Pt's states no medical or surgical changes since previsit or office visit. 

## 2022-02-09 NOTE — Patient Instructions (Signed)
Handouts given for polyps and diverticulosis.  YOU HAD AN ENDOSCOPIC PROCEDURE TODAY AT THE [facility]:   Refer to the procedure report that was given to you for any specific questions about what was found during the examination.  If the procedure report does not answer your questions, please call your gastroenterologist to clarify.  If you requested that your care partner not be given the details of your procedure findings, then the procedure report has been included in a sealed envelope for you to review at your convenience later.  YOU SHOULD EXPECT: Some feelings of bloating in the abdomen. Passage of more gas than usual.  Walking can help get rid of the air that was put into your GI tract during the procedure and reduce the bloating. If you had a lower endoscopy (such as a colonoscopy or flexible sigmoidoscopy) you may notice spotting of blood in your stool or on the toilet paper. If you underwent a bowel prep for your procedure, you may not have a normal bowel movement for a few days.  Please Note:  You might notice some irritation and congestion in your nose or some drainage.  This is from the oxygen used during your procedure.  There is no need for concern and it should clear up in a day or so.  SYMPTOMS TO REPORT IMMEDIATELY:  Following lower endoscopy (colonoscopy or flexible sigmoidoscopy):  Excessive amounts of blood in the stool  Significant tenderness or worsening of abdominal pains  Swelling of the abdomen that is new, acute  Fever of 100F or higher  For urgent or emergent issues, a gastroenterologist can be reached at any hour by calling [phone]. Do not use MyChart messaging for urgent concerns.    DIET:  We do recommend a small meal at first, but then you may proceed to your regular diet.  Drink plenty of fluids but you should avoid alcoholic beverages for 24 hours.  ACTIVITY:  You should plan to take it easy for the rest of today and you should NOT DRIVE  or use heavy machinery until tomorrow (because of the sedation medicines used during the test).    FOLLOW UP: Our staff will call the number listed on your records 48-72 hours following your procedure to check on you and address any questions or concerns that you may have regarding the information given to you following your procedure. If we do not reach you, we will leave a message.  We will attempt to reach you two times.  During this call, we will ask if you have developed any symptoms of COVID 19. If you develop any symptoms (ie: fever, flu-like symptoms, shortness of breath, cough etc.) before then, please call [phone].  If you test positive for Covid 19 in the 2 weeks post procedure, please call and report this information to us.    If any biopsies were taken you will be contacted by phone or by letter within the next 1-3 weeks.  Please call us at [phone] if you have not heard about the biopsies in 3 weeks.    SIGNATURES/CONFIDENTIALITY: You and/or your care partner have signed paperwork which will be entered into your electronic medical record.  These signatures attest to the fact that that the information above on your After Visit Summary has been reviewed and is understood.  Full responsibility of the confidentiality of this discharge information lies with you and/or your care-partner.  

## 2022-02-09 NOTE — Progress Notes (Signed)
Called to room to assist during endoscopic procedure.  Patient ID and intended procedure confirmed with present staff. Received instructions for my participation in the procedure from the performing physician.  

## 2022-02-09 NOTE — Progress Notes (Signed)
[facility] History and Physical ? ? ?Primary Care Physician:  Ginger Organ., MD ? ? ?Reason for Procedure:  History of adenomatous colon polyps ? ?Plan:    Surveillance colonoscopy with possible interventions as needed ? ? ? ? ?HPI: Karina Molina is a very pleasant 66 y.o. female here for surveillance colonoscopy. ?Denies any nausea, vomiting, abdominal pain, melena or bright red blood per rectum ? ?The risks and benefits as well as alternatives of endoscopic procedure(s) have been discussed and reviewed. All questions answered. The patient agrees to proceed. ? ? ? ?Past Medical History:  ?Diagnosis Date  ? Allergy   ? SEASONAL  ? Hyperlipidemia   ? IUD 09/2011  ? MIRENA  ? ? ?Past Surgical History:  ?Procedure Laterality Date  ? COLONOSCOPY    ? DENTAL SURGERY    ? age 73  ? INTRAUTERINE DEVICE INSERTION  10/12/2011  ? Mirena  ? POLYPECTOMY    ? ? ?Prior to Admission medications   ?Medication Sig Start Date End Date Taking? Authorizing Provider  ?Bioflavonoid Products (ESTER-C) TABS Take by mouth daily.   Yes [provider]  ?Cholecalciferol (D3-1000) 25 MCG (1000 UT) tablet Take 1,000 Units by mouth daily.   Yes [provider]  ?Coenzyme Q10 (COQ-10) 100 MG CAPS SMARTSIG:1 By Mouth 08/28/21  Yes [provider]  ?fluticasone (FLONASE) 50 MCG/ACT nasal spray Place 1 spray into both nostrils as needed. 10/23/19  Yes [provider]  ?glucosamine-chondroitin 500-400 MG tablet Take 1 tablet by mouth 3 (three) times daily.   Yes [provider]  ?Multiple Vitamin (MULTIVITAMIN) tablet Take 1 tablet by mouth daily.   Yes [provider]  ?rosuvastatin (CRESTOR) 10 MG tablet TK 1 T PO QD 05/10/18  Yes [provider]  ?vitamin B-12 (CYANOCOBALAMIN) 500 MCG tablet Take 500 mcg by mouth daily.   Yes [provider]  ?diclofenac Sodium (VOLTAREN) 1 % GEL Apply 2 grams to painful upper extremity joints and 4 grams to lower  extremity joints every 6 hours as neeeded 02/08/18   [provider]  ?montelukast (SINGULAIR) 10 MG tablet Take 10 mg by mouth as needed.    [provider]  ?naproxen (NAPROSYN) 500 MG tablet Take 500 mg by mouth 2 (two) times daily with a meal. ?Patient not taking: Reported on 02/02/2022    [provider]  ?NON FORMULARY Do Serena Croissant Brand Fish oil, Cellular Vitality, Alpha CRS , Mito Max, Micro Plex ?Patient not taking: Reported on 02/02/2022    [provider]  ?Turmeric 400 MG CAPS Take by mouth. 08/28/21   [provider]  ?valACYclovir (VALTREX) 500 MG tablet Take 1 twice daily 3-5 days as needed 01/06/17   [name], NP  ? ? ?Current Outpatient Medications  ?Medication Sig Dispense Refill  ? Bioflavonoid Products (ESTER-C) TABS Take by mouth daily.    ? Cholecalciferol (D3-1000) 25 MCG (1000 UT) tablet Take 1,000 Units by mouth daily.    ? Coenzyme Q10 (COQ-10) 100 MG CAPS SMARTSIG:1 By Mouth    ? fluticasone (FLONASE) 50 MCG/ACT nasal spray Place 1 spray into both nostrils as needed.    ? glucosamine-chondroitin 500-400 MG tablet Take 1 tablet by mouth 3 (three) times daily.    ? Multiple Vitamin (MULTIVITAMIN) tablet Take 1 tablet by mouth daily.    ? rosuvastatin (CRESTOR) 10 MG tablet TK 1 T PO QD  9  ? vitamin B-12 (CYANOCOBALAMIN) 500 MCG tablet Take 500  mcg by mouth daily.    ? diclofenac Sodium (VOLTAREN) 1 % GEL Apply 2 grams to painful upper extremity joints and 4 grams to lower extremity joints every 6 hours as neeeded    ? montelukast (SINGULAIR) 10 MG tablet Take 10 mg by mouth as needed.    ? naproxen (NAPROSYN) 500 MG tablet Take 500 mg by mouth 2 (two) times daily with a meal. (Patient not taking: Reported on 02/02/2022)    ? NON FORMULARY Do Terra Brand Brand Fish oil, Cellular Vitality, Alpha CRS , Mito Max, Micro Plex (Patient not taking: Reported on 02/02/2022)    ? Turmeric 400 MG CAPS Take by mouth.    ? valACYclovir (VALTREX) 500 MG  tablet Take 1 twice daily 3-5 days as needed 21 tablet 6  ? ?Current Facility-Administered Medications  ?Medication Dose Route Frequency Provider Last Rate Last Admin  ? 0.9 %  sodium chloride infusion  500 mL Intravenous Once Correen Bubolz, Venia Minks, MD      ? ? ?Allergies as of 02/09/2022 - Review Complete 02/09/2022  ?Allergen Reaction Noted  ? Sulfa antibiotics Other (See Comments) 05/08/2011  ? ? ?Family History  ?Problem Relation Age of Onset  ? Diabetes Father   ? Heart disease Father   ? Prostate cancer Father   ? Diabetes Brother   ? Diabetes Paternal Grandmother   ? Diabetes Son   ? Kidney failure Son   ? Colon cancer Neg Hx   ? Colon polyps Neg Hx   ? Esophageal cancer Neg Hx   ? Stomach cancer Neg Hx   ? Rectal cancer Neg Hx   ? ? ?Social History  ? ?Socioeconomic History  ? Marital status: Single  ?  Spouse name: Not on file  ? Number of children: 2  ? Years of education: Not on file  ? Highest education level: Not on file  ?Occupational History  ? Not on file  ?Tobacco Use  ? Smoking status: Former  ?  Packs/day: 1.00  ?  Years: 15.00  ?  Pack years: 15.00  ?  Types: Cigarettes  ?  Quit date: 11/16/2005  ?  Years since quitting: 16.2  ?  Passive exposure: Past ("LONG TIME AGO")  ? Smokeless tobacco: Never  ?Vaping Use  ? Vaping Use: Never used  ?Substance and Sexual Activity  ? Alcohol use: Yes  ?  Alcohol/week: 7.0 standard drinks  ?  Types: 7 Glasses of wine per week  ?  Comment: NIGHTLY  ? Drug use: Yes  ?  Types: Marijuana  ?  Comment: Occas,NOT ON REGULAR BASISNIGHTLY  ? Sexual activity: Not Currently  ?  Comment: 1st intercourse 43 yo-5 partners  ?Other Topics Concern  ? Not on file  ?Social History Narrative  ? Not on file  ? ?Social Determinants of Health  ? ?Financial Resource Strain: Not on file  ?Food Insecurity: Not on file  ?Transportation Needs: Not on file  ?Physical Activity: Not on file  ?Stress: Not on file  ?Social Connections: Not on file  ?Intimate Partner Violence: Not on file   ? ? ?Review of Systems: ? ?All other review of systems negative except as mentioned in the HPI. ? ?Physical Exam: ?Vital signs in last 24 hours: ?BP (!) 149/101   Pulse 86   Temp 98.2 ?F (36.8 ?C)   Ht '5\' 4"'$  (1.626 m)   Wt 155 lb (70.3 kg)   LMP 11/23/2012   SpO2 99%   BMI 26.61 kg/m?  ?  General:   Alert, NAD ?Lungs:  Clear .   ?Heart:  Regular rate and rhythm ?Abdomen:  Soft, nontender and nondistended. ?Neuro/Psych:  Alert and cooperative. Normal mood and affect. A and O x 3 ? ?Reviewed labs, radiology imaging, old records and pertinent past GI work up ? ?Patient is appropriate for planned procedure(s) and anesthesia in an ambulatory setting ? ? ?K. Denzil Magnuson , MD ?[phone]  ? ? ?  ?

## 2022-02-09 NOTE — Op Note (Signed)
[facility] ?Patient Name: Karina Molina ?Procedure Date: 02/09/2022 8:37 AM ?MRN: [id number] ?Endoscopist: Mauri Pole , MD ?Age: 66 ?Referring MD:  ?Date of Birth: July 25, 1956 ?Gender: Female ?Account #: [id number] ?Procedure:                Colonoscopy ?Indications:              High risk colon cancer surveillance: Personal  ?                          history of colonic polyps, High risk colon cancer  ?                          surveillance: Personal history of adenoma less than  ?                          10 mm in size ?Medicines:                Monitored Anesthesia Care ?Procedure:                Pre-Anesthesia Assessment: ?                          - Prior to the procedure, a History and Physical  ?                          was performed, and patient medications and  ?                          allergies were reviewed. The patient's tolerance of  ?                          previous anesthesia was also reviewed. The risks  ?                          and benefits of the procedure and the sedation  ?                          options and risks were discussed with the patient.  ?                          All questions were answered, and informed consent  ?                          was obtained. Prior Anticoagulants: The patient has  ?                          taken no previous anticoagulant or antiplatelet  ?                          agents. ASA Grade Assessment: II - A patient with  ?                          mild systemic disease. After reviewing the risks  ?  and benefits, the patient was deemed in  ?                          satisfactory condition to undergo the procedure. ?                          After obtaining informed consent, the colonoscope  ?                          was passed under direct vision. Throughout the  ?                          procedure, the patient's blood pressure, pulse, and  ?                          oxygen saturations were monitored  continuously. The  ?                          PCF-HQ190L Colonoscope was introduced through the  ?                          anus and advanced to the the cecum, identified by  ?                          appendiceal orifice and ileocecal valve. The  ?                          colonoscopy was performed without difficulty. The  ?                          patient tolerated the procedure well. The quality  ?                          of the bowel preparation was good. The ileocecal  ?                          valve, appendiceal orifice, and rectum were  ?                          photographed. ?Scope In: 8:49:33 AM ?Scope Out: 9:09:33 AM ?Scope Withdrawal Time: 0 hours 11 minutes 31 seconds  ?Total Procedure Duration: 0 hours 20 minutes 0 seconds  ?Findings:                 The perianal and digital rectal examinations were  ?                          normal. ?                          Two sessile polyps were found in the ascending  ?                          colon and cecum. The polyps were 10 to 12 mm in  ?  size. These polyps were removed with a cold snare.  ?                          Resection and retrieval were complete. ?                          Multiple small and large-mouthed diverticula were  ?                          found in the sigmoid colon, descending colon and  ?                          ascending colon. There was evidence of diverticular  ?                          spasm. Peri-diverticular erythema was seen. There  ?                          was evidence of an impacted diverticulum. ?                          Non-bleeding external and internal hemorrhoids were  ?                          found during retroflexion. The hemorrhoids were  ?                          small. ?Complications:            No immediate complications. ?Estimated Blood Loss:     Estimated blood loss was minimal. ?Impression:               - Two 10 to 12 mm polyps in the ascending colon and  ?                           in the cecum, removed with a cold snare. Resected  ?                          and retrieved. ?                          - Moderate diverticulosis in the sigmoid colon, in  ?                          the descending colon and in the ascending colon.  ?                          There was evidence of diverticular spasm.  ?                          Peri-diverticular erythema was seen. There was  ?                          evidence of an impacted diverticulum. ?                          -  Non-bleeding external and internal hemorrhoids. ?Recommendation:           - Patient has a contact number available for  ?                          emergencies. The signs and symptoms of potential  ?                          delayed complications were discussed with the  ?                          patient. Return to normal activities tomorrow.  ?                          Written discharge instructions were provided to the  ?                          patient. ?                          - Resume previous diet. ?                          - Continue present medications. ?                          - Await pathology results. ?                          - Repeat colonoscopy in 3 - 5 years for  ?                          surveillance based on pathology results. ?Mauri Pole, MD ?02/09/2022 9:15:34 AM ?This report has been signed electronically. ?

## 2022-02-09 NOTE — Progress Notes (Signed)
Sedate, gd SR, tolerated procedure well, VSS, report to RN 

## 2022-02-09 NOTE — Progress Notes (Signed)
D.T. vital signs. °

## 2022-02-11 ENCOUNTER — Telehealth: Payer: Self-pay

## 2022-02-11 NOTE — Telephone Encounter (Signed)
?  Follow up Call- ? ? ?  02/09/2022  ?  7:36 AM  ?Call back number  ?Post procedure Call Back phone  # [phone]  ?Permission to leave phone message Yes  ?  ? ?Patient questions: ? ?Do you have a fever, pain , or abdominal swelling? No. ?Pain Score  0 * ? ?Have you tolerated food without any problems? Yes.   ? ?Have you been able to return to your normal activities? Yes.   ? ?Do you have any questions about your discharge instructions: ?Diet   No. ?Medications  No. ?Follow up visit  No. ? ?Do you have questions or concerns about your Care? No. ? ?Actions: ?* If pain score is 4 or above: ?No action needed, pain <4. ? ? ? ? ?

## 2022-02-12 ENCOUNTER — Encounter: Payer: Self-pay | Admitting: Gastroenterology

## 2022-04-09 ENCOUNTER — Other Ambulatory Visit: Payer: Self-pay | Admitting: Internal Medicine

## 2022-04-09 DIAGNOSIS — F17201 Nicotine dependence, unspecified, in remission: Secondary | ICD-10-CM

## 2022-05-13 ENCOUNTER — Other Ambulatory Visit: Payer: BLUE CROSS/BLUE SHIELD

## 2023-01-04 ENCOUNTER — Other Ambulatory Visit: Payer: Self-pay | Admitting: Internal Medicine

## 2023-01-04 DIAGNOSIS — Z1231 Encounter for screening mammogram for malignant neoplasm of breast: Secondary | ICD-10-CM

## 2023-02-16 ENCOUNTER — Ambulatory Visit
Admission: RE | Admit: 2023-02-16 | Discharge: 2023-02-16 | Disposition: A | Discharge status: Home / Self Care | Payer: 59 | Source: Ambulatory Visit | Attending: Internal Medicine | Admitting: Internal Medicine

## 2023-02-16 DIAGNOSIS — Z1231 Encounter for screening mammogram for malignant neoplasm of breast: Secondary | ICD-10-CM

## 2024-02-24 ENCOUNTER — Encounter: Payer: Self-pay | Admitting: Nurse Practitioner

## 2024-02-24 ENCOUNTER — Ambulatory Visit: Payer: BLUE CROSS/BLUE SHIELD | Admitting: Nurse Practitioner

## 2024-02-24 VITALS — BP 118/86 | HR 72 | Ht 63.25 in | Wt 158.0 lb

## 2024-02-24 DIAGNOSIS — N952 Postmenopausal atrophic vaginitis: Secondary | ICD-10-CM | POA: Diagnosis not present

## 2024-02-24 DIAGNOSIS — Z1331 Encounter for screening for depression: Secondary | ICD-10-CM | POA: Diagnosis not present

## 2024-02-24 DIAGNOSIS — Z01419 Encounter for gynecological examination (general) (routine) without abnormal findings: Secondary | ICD-10-CM | POA: Diagnosis not present

## 2024-02-24 DIAGNOSIS — Z780 Asymptomatic menopausal state: Secondary | ICD-10-CM

## 2024-02-24 NOTE — Progress Notes (Signed)
 Karina Molina [birth year] [id number]   History:  68 y.o. G3P0102 presents for annual exam. Postmenopausal - no HRT, no bleeding. Normal pap history. HLD, bone health managed by PCP.  Gynecologic History Patient's last menstrual period was 11/23/2012.   Contraception/Family planning: post menopausal status Sexually active: No  Health Maintenance Last Pap: 12/02/2021. Results were: Normal neg HPV Last mammogram: 02/16/2023. Results were: Normal Last colonoscopy: 02/09/2022. Results were: SSP, 3-year repeat Last Dexa: Less than 5 years ago. Normal per patient  Flowsheet Row Office Visit from 02/24/2024 in [facility]  PHQ-2 Total Score 0        Past medical history, past surgical history, family history and social history were all reviewed and documented in the EPIC chart. Single. 2 children, one in [place], one in [place]. 4 grandchildren ages 47-13, 2 boys and 2 girls. Works remote in Education officer, environmental.   ROS:  A ROS was performed and pertinent positives and negatives are included.  Exam:  Vitals:   02/24/24 1143  BP: 118/86  Pulse: 72  SpO2: 98%  Weight: 158 lb (71.7 kg)  Height: 5' 3.25" (1.607 m)    Body mass index is 27.77 kg/m.  General appearance:  Normal Thyroid:  Symmetrical, normal in size, without palpable masses or nodularity. Respiratory  Auscultation:  Clear without wheezing or rhonchi Cardiovascular  Auscultation:  Regular rate, without rubs, murmurs or gallops  Edema/varicosities:  Not grossly evident Abdominal  Soft,nontender, without masses, guarding or rebound.  Liver/spleen:  No organomegaly noted  Hernia:  None appreciated  Skin  Inspection:  Grossly normal Breasts: Examined lying and sitting.   Right: Without masses, retractions, nipple discharge or axillary adenopathy.   Left: Without masses, retractions, nipple discharge or axillary adenopathy. Pelvic: External genitalia:  no lesions              Urethra:  normal  appearing urethra with no masses, tenderness or lesions              Bartholins and Skenes: normal                 Vagina: normal appearing vagina with normal color and discharge, no lesions. Atrophic changes              Cervix: no lesions Bimanual Exam:  Uterus:  no masses or tenderness              Adnexa: no mass, fullness, tenderness              Rectovaginal: Deferred              Anus:  normal, no lesions  Patient informed chaperone available to be present for breast and pelvic exam. Patient has requested no chaperone to be present. Patient has been advised what will be completed during breast and pelvic exam.   Assessment/Plan:  68 y.o. [id number] for annual exam.   Well female exam with routine gynecological exam - Education provided on SBEs, importance of preventative screenings, current guidelines, high calcium diet, regular exercise, and multivitamin daily.  Labs with PCP.   Postmenopausal - no HRT, no bleeding.   Screening for cervical cancer - Normal Pap history. Will repeat at 5-year interval per guidelines.   Screening for breast cancer - Normal mammogram history. Continue annual screenings.  Normal breast exam today.  Screening for colon cancer - 01/2022 colonoscopy. Will repeat at GI's recommended interval.   Screening for osteoporosis - Normal bone density. Managed by PCP.  Return in about 1 year (around 02/23/2025) for Annual.      Olivia Mackie DNP, 11:58 AM 02/24/2024

## 2024-06-05 ENCOUNTER — Other Ambulatory Visit (HOSPITAL_COMMUNITY): Payer: Self-pay | Admitting: Internal Medicine

## 2024-06-05 DIAGNOSIS — R0989 Other specified symptoms and signs involving the circulatory and respiratory systems: Secondary | ICD-10-CM

## 2024-06-06 ENCOUNTER — Ambulatory Visit (HOSPITAL_COMMUNITY)
Admission: RE | Admit: 2024-06-06 | Discharge: 2024-06-06 | Disposition: A | Discharge status: Home / Self Care | Source: Ambulatory Visit | Attending: Vascular Surgery | Admitting: Vascular Surgery

## 2024-06-06 DIAGNOSIS — R0989 Other specified symptoms and signs involving the circulatory and respiratory systems: Secondary | ICD-10-CM | POA: Diagnosis not present

## 2025-02-27 ENCOUNTER — Ambulatory Visit: Admitting: Nurse Practitioner
